# Patient Record
Sex: Male | Born: 1980 | Hispanic: No | Marital: Married | Smoking: Former smoker
Health system: Southern US, Community
[De-identification: ages and names within clinical notes are randomized; demographics above are authoritative.]

## PROBLEM LIST (undated history)

## (undated) DIAGNOSIS — G8929 Other chronic pain: Secondary | ICD-10-CM

## (undated) DIAGNOSIS — M25519 Pain in unspecified shoulder: Secondary | ICD-10-CM

## (undated) HISTORY — PX: ANKLE SURGERY: SHX546

## (undated) HISTORY — PX: FOOT SURGERY: SHX648

## (undated) HISTORY — PX: KNEE SURGERY: SHX244

## (undated) HISTORY — PX: SHOULDER SURGERY: SHX246

## (undated) HISTORY — PX: ELBOW SURGERY: SHX618

## (undated) HISTORY — PX: FACIAL FRACTURE SURGERY: SHX1570

---

## 2003-07-02 ENCOUNTER — Ambulatory Visit (HOSPITAL_BASED_OUTPATIENT_CLINIC_OR_DEPARTMENT_OTHER): Admission: RE | Admit: 2003-07-02 | Discharge: 2003-07-02 | Payer: Self-pay | Admitting: Orthopaedic Surgery

## 2012-10-12 ENCOUNTER — Emergency Department (INDEPENDENT_AMBULATORY_CARE_PROVIDER_SITE_OTHER)
Admission: EM | Admit: 2012-10-12 | Discharge: 2012-10-12 | Disposition: A | Discharge status: Home / Self Care | Payer: BC Managed Care – PPO | Source: Home / Self Care

## 2012-10-12 DIAGNOSIS — Z23 Encounter for immunization: Secondary | ICD-10-CM

## 2012-10-12 MED ORDER — INFLUENZA VAC TYP A&B SURF ANT IM INJ
0.5000 mL | INJECTION | Freq: Once | INTRAMUSCULAR | Status: AC
  Administered 2012-10-12: 0.5 mL via INTRAMUSCULAR

## 2012-10-12 NOTE — ED Notes (Signed)
Flu shot

## 2014-10-07 ENCOUNTER — Emergency Department (INDEPENDENT_AMBULATORY_CARE_PROVIDER_SITE_OTHER)
Admission: EM | Admit: 2014-10-07 | Discharge: 2014-10-07 | Disposition: A | Discharge status: Home / Self Care | Payer: 59 | Source: Home / Self Care | Attending: Emergency Medicine | Admitting: Emergency Medicine

## 2014-10-07 ENCOUNTER — Encounter: Payer: Self-pay | Admitting: *Deleted

## 2014-10-07 DIAGNOSIS — J208 Acute bronchitis due to other specified organisms: Secondary | ICD-10-CM

## 2014-10-07 DIAGNOSIS — J209 Acute bronchitis, unspecified: Secondary | ICD-10-CM | POA: Diagnosis not present

## 2014-10-07 DIAGNOSIS — J029 Acute pharyngitis, unspecified: Secondary | ICD-10-CM

## 2014-10-07 HISTORY — DX: Other chronic pain: G89.29

## 2014-10-07 HISTORY — DX: Pain in unspecified shoulder: M25.519

## 2014-10-07 LAB — POCT INFLUENZA A/B
Influenza A, POC: NEGATIVE
Influenza B, POC: NEGATIVE

## 2014-10-07 MED ORDER — AZITHROMYCIN 250 MG PO TABS: 250.0000 mg | ORAL_TABLET | Freq: Every day | ORAL | Status: DC

## 2014-10-07 MED ORDER — BENZONATATE 100 MG PO CAPS: 100.0000 mg | ORAL_CAPSULE | Freq: Three times a day (TID) | ORAL | Status: DC

## 2014-10-07 NOTE — ED Provider Notes (Signed)
CSN: [id number]     Arrival date & time 10/07/14  1528 History   First MD Initiated Contact with Patient 10/07/14 1607     Chief Complaint  Patient presents with  . Generalized Body Aches  . Cough  . Nasal Congestion   (Consider location/radiation/quality/duration/timing/severity/associated sxs/prior Treatment) Patient is a 34 y.o. male presenting with cough. The history is provided by the patient. No language interpreter was used.  Cough Cough characteristics:  Productive Sputum characteristics:  Green Severity:  Moderate Onset quality:  Gradual Duration:  1 week Timing:  Constant Progression:  Worsening Chronicity:  New Smoker: no   Context: upper respiratory infection   Relieved by:  Nothing Worsened by:  Nothing tried Ineffective treatments:  None tried Associated symptoms: rhinorrhea and sinus congestion     Past Medical History  Diagnosis Date  . Chronic shoulder pain right   Past Surgical History  Procedure Laterality Date  . Shoulder surgery Right   . Elbow surgery Left   . Facial fracture surgery    . Foot surgery Bilateral   . Knee surgery Left   . Ankle surgery Right    History reviewed. No pertinent family history. History  Substance Use Topics  . Smoking status: Former Games developermoker  . Smokeless tobacco: Not on file  . Alcohol Use: No    Review of Systems  HENT: Positive for rhinorrhea.   Respiratory: Positive for cough.   All other systems reviewed and are negative.   Allergies  Penicillins  Home Medications   Prior to Admission medications   Medication Sig Start Date End Date Taking? Authorizing Provider  HYDROcodone-acetaminophen (NORCO) 10-325 MG per tablet Take 1 tablet by mouth every 6 (six) hours as needed.   Yes Historical Provider, MD  pregabalin (LYRICA) 100 MG capsule Take 100 mg by mouth 2 (two) times daily.   Yes Historical Provider, MD  azithromycin (ZITHROMAX) 250 MG tablet Take 1 tablet (250 mg total) by mouth daily. Take first 2  tablets together, then 1 every day until finished. 10/07/14   Elson AreasLeslie K Altariq Goodall, PA-C  benzonatate (TESSALON) 100 MG capsule Take 1 capsule (100 mg total) by mouth every 8 (eight) hours. 10/07/14   Elson AreasLeslie K Izeah Vossler, PA-C   BP 131/83 mmHg  Pulse 87  Temp(Src) 98 F (36.7 C) (Oral)  Resp 18  Ht 5' 11.5" (1.816 m)  Wt 283 lb (128.368 kg)  BMI 38.92 kg/m2  SpO2 97% Physical Exam  Constitutional: He is oriented to person, place, and time. He appears well-developed and well-nourished.  HENT:  Head: Normocephalic and atraumatic.  Right Ear: External ear normal.  Left Ear: External ear normal.  Nose: Nose normal.  Mouth/Throat: Oropharynx is clear and moist.  Eyes: EOM are normal. Pupils are equal, round, and reactive to light.  Neck: Normal range of motion.  Cardiovascular: Normal rate and normal heart sounds.   Pulmonary/Chest: Effort normal.  Abdominal: Soft. He exhibits no distension.  Musculoskeletal: Normal range of motion.  Neurological: He is alert and oriented to person, place, and time.  Skin: Skin is warm.  Psychiatric: He has a normal mood and affect.  Nursing note and vitals reviewed.   ED Course  Procedures (including critical care time) Labs Review Labs Reviewed  POCT INFLUENZA A/B    Imaging Review No results found.   MDM   1. Acute bronchitis due to other specified organisms    zithromax Tessalon Return if any problems.   See your Physicain for recheck if symptoms persist  past one week    Elson Areas, [place] 10/07/14 1918

## 2014-10-07 NOTE — ED Notes (Signed)
Pt c/o body aches, productive cough, nasal congestion and HA x 1 wk. No documented fever, he reports feeling warm to the touch yesterday.

## 2014-10-07 NOTE — Discharge Instructions (Signed)

## 2015-05-08 ENCOUNTER — Emergency Department (INDEPENDENT_AMBULATORY_CARE_PROVIDER_SITE_OTHER)
Admission: EM | Admit: 2015-05-08 | Discharge: 2015-05-08 | Disposition: A | Discharge status: Home / Self Care | Payer: BLUE CROSS/BLUE SHIELD | Source: Home / Self Care | Attending: Family Medicine | Admitting: Family Medicine

## 2015-05-08 ENCOUNTER — Encounter: Payer: Self-pay | Admitting: Emergency Medicine

## 2015-05-08 DIAGNOSIS — L03116 Cellulitis of left lower limb: Secondary | ICD-10-CM | POA: Diagnosis not present

## 2015-05-08 MED ORDER — MUPIROCIN 2 % EX OINT: 1.0000 "application " | TOPICAL_OINTMENT | Freq: Three times a day (TID) | CUTANEOUS | Status: DC

## 2015-05-08 MED ORDER — DOXYCYCLINE HYCLATE 100 MG PO CAPS: 100.0000 mg | ORAL_CAPSULE | Freq: Two times a day (BID) | ORAL | Status: DC

## 2015-05-08 NOTE — ED Provider Notes (Signed)
CSN: [id number]     Arrival date & time 05/08/15  1935 History   First MD Initiated Contact with Patient 05/08/15 2000     Chief Complaint  Patient presents with  . Insect Bite     HPI Comments: Four days ago patient noticed a swollen painful area on his left lower posterior leg above the ankle, and wondered if it had been caused by an insect sting.  The next day the lesion drained purulent fluid, and the swelling and redness have persisted.  The lesion continues to drain mucoid fluid.  He states that his Tdap is current.  The history is provided by the patient.    Past Medical History  Diagnosis Date  . Chronic shoulder pain right   Past Surgical History  Procedure Laterality Date  . Shoulder surgery Right   . Elbow surgery Left   . Facial fracture surgery    . Foot surgery Bilateral   . Knee surgery Left   . Ankle surgery Right    History reviewed. No pertinent family history. History  Substance Use Topics  . Smoking status: Former Games developer  . Smokeless tobacco: Not on file  . Alcohol Use: No    Review of Systems  Constitutional: Negative for fever, chills, diaphoresis and fatigue.  HENT: Negative.   Eyes: Negative.   Respiratory: Negative.   Cardiovascular: Negative.   Gastrointestinal: Negative.   Genitourinary: Negative.   Musculoskeletal: Negative.   Skin: Positive for wound.  Neurological: Negative for headaches.    Allergies  Penicillins  Home Medications   Prior to Admission medications   Medication Sig Start Date End Date Taking? Authorizing Provider  doxycycline (VIBRAMYCIN) 100 MG capsule Take 1 capsule (100 mg total) by mouth 2 (two) times daily. Take with food. 05/08/15   Lattie Haw, MD  HYDROcodone-acetaminophen (NORCO) 10-325 MG per tablet Take 1 tablet by mouth every 6 (six) hours as needed.    Historical Provider, MD  mupirocin ointment (BACTROBAN) 2 % Apply 1 application topically 3 (three) times daily. 05/08/15   Lattie Haw, MD  pregabalin  (LYRICA) 100 MG capsule Take 100 mg by mouth 2 (two) times daily.    Historical Provider, MD   BP 125/86 mmHg  Pulse 88  Temp(Src) 98.1 F (36.7 C) (Oral)  SpO2 96% Physical Exam  Constitutional: He is oriented to person, place, and time. He appears well-developed and well-nourished. No distress.  HENT:  Head: Atraumatic.  Eyes: Conjunctivae are normal. Pupils are equal, round, and reactive to light.  Neck: Neck supple.  Cardiovascular: Normal heart sounds.   Pulmonary/Chest: Breath sounds normal.  Abdominal: There is no tenderness.  Musculoskeletal:       Feet:  Left posterior ankle above the insertion of achilles tendon is a 2 cm diameter superficial erythematous raised area with central drainage of a small amount of honey colored fluid.  The area is tender to palpation.  He has no achilles pain when plantar flexing the left foot.  Lymphadenopathy:    He has no cervical adenopathy.  Neurological: He is alert and oriented to person, place, and time.  Skin: Skin is warm and dry.  Nursing note and vitals reviewed.   ED Course  Procedures  None    Labs Reviewed  WOUND CULTURE      MDM   1. Cellulitis of left ankle    Culture pending. Begin doxycline  BID for staph coverage, and topical Bactroban.  Apply heat pad several times daily. Keep wound bandaged, and clean and dry. Change bandage daily Followup with Urgent care center if becomes worse while on vacation.     Lattie Haw, MD 05/10/15 [phone]

## 2015-05-08 NOTE — Discharge Instructions (Signed)
Keep wound bandaged, and clean and dry. Change bandage daily   Cellulitis Cellulitis is an infection of the skin and the tissue beneath it. The infected area is usually red and tender. Cellulitis occurs most often in the arms and lower legs.  CAUSES  Cellulitis is caused by bacteria that enter the skin through cracks or cuts in the skin. The most common types of bacteria that cause cellulitis are staphylococci and streptococci. SIGNS AND SYMPTOMS   Redness and warmth.  Swelling.  Tenderness or pain.  Fever. DIAGNOSIS  Your health care provider can usually determine what is wrong based on a physical exam. Blood tests may also be done. TREATMENT  Treatment usually involves taking an antibiotic medicine. HOME CARE INSTRUCTIONS   Take your antibiotic medicine as directed by your health care provider. Finish the antibiotic even if you start to feel better.  Keep the infected arm or leg elevated to reduce swelling.  Apply a warm cloth to the affected area up to 4 times per day to relieve pain.  Take medicines only as directed by your health care provider.  Keep all follow-up visits as directed by your health care provider. SEEK MEDICAL CARE IF:   You notice red streaks coming from the infected area.  Your red area gets larger or turns dark in color.  Your bone or joint underneath the infected area becomes painful after the skin has healed.  Your infection returns in the same area or another area.  You notice a swollen bump in the infected area.  You develop new symptoms.  You have a fever. SEEK IMMEDIATE MEDICAL CARE IF:   You feel very sleepy.  You develop vomiting or diarrhea.  You have a general ill feeling (malaise) with muscle aches and pains. MAKE SURE YOU:   Understand these instructions.  Will watch your condition.  Will get help right away if you are not doing well or get worse. Document Released: 06/29/2005 Document Revised: 02/03/2014 Document  Reviewed: 12/05/2011 [facility] Patient Information 2015 [place], [place]. This information is not intended to replace advice given to you by your health care provider. Make sure you discuss any questions you have with your health care provider.

## 2015-05-08 NOTE — ED Notes (Signed)
Pt states 3 days ago he noticed a pus filled area that looked like an insect bite on his left ankle. States he has had drainage...swelling and redness since then.

## 2015-05-14 ENCOUNTER — Telehealth: Payer: Self-pay | Admitting: Emergency Medicine

## 2015-05-16 LAB — WOUND CULTURE
GRAM STAIN: NONE SEEN
Gram Stain: NONE SEEN

## 2016-06-23 DIAGNOSIS — Z23 Encounter for immunization: Secondary | ICD-10-CM | POA: Diagnosis not present

## 2016-07-01 DIAGNOSIS — I83891 Varicose veins of right lower extremities with other complications: Secondary | ICD-10-CM | POA: Diagnosis not present

## 2016-08-02 DIAGNOSIS — I83891 Varicose veins of right lower extremities with other complications: Secondary | ICD-10-CM | POA: Diagnosis not present

## 2016-10-04 DIAGNOSIS — I83891 Varicose veins of right lower extremities with other complications: Secondary | ICD-10-CM | POA: Diagnosis not present

## 2016-11-02 DIAGNOSIS — Z0000 Encounter for general adult medical examination without abnormal findings: Secondary | ICD-10-CM | POA: Diagnosis not present

## 2016-11-02 DIAGNOSIS — Z13220 Encounter for screening for lipoid disorders: Secondary | ICD-10-CM | POA: Diagnosis not present

## 2016-11-02 DIAGNOSIS — R7303 Prediabetes: Secondary | ICD-10-CM | POA: Diagnosis not present

## 2016-11-02 DIAGNOSIS — Z6839 Body mass index (BMI) 39.0-39.9, adult: Secondary | ICD-10-CM | POA: Diagnosis not present

## 2017-04-26 DIAGNOSIS — F17200 Nicotine dependence, unspecified, uncomplicated: Secondary | ICD-10-CM | POA: Diagnosis not present

## 2017-04-26 DIAGNOSIS — I10 Essential (primary) hypertension: Secondary | ICD-10-CM | POA: Diagnosis not present

## 2017-04-26 DIAGNOSIS — Z716 Tobacco abuse counseling: Secondary | ICD-10-CM | POA: Diagnosis not present

## 2017-04-26 DIAGNOSIS — G4709 Other insomnia: Secondary | ICD-10-CM | POA: Diagnosis not present

## 2017-05-22 DIAGNOSIS — R899 Unspecified abnormal finding in specimens from other organs, systems and tissues: Secondary | ICD-10-CM | POA: Diagnosis not present

## 2017-05-26 DIAGNOSIS — Z3009 Encounter for other general counseling and advice on contraception: Secondary | ICD-10-CM | POA: Diagnosis not present

## 2017-05-26 DIAGNOSIS — E669 Obesity, unspecified: Secondary | ICD-10-CM | POA: Diagnosis not present

## 2017-05-26 DIAGNOSIS — I10 Essential (primary) hypertension: Secondary | ICD-10-CM | POA: Diagnosis not present

## 2017-06-16 DIAGNOSIS — I10 Essential (primary) hypertension: Secondary | ICD-10-CM | POA: Diagnosis not present

## 2017-06-16 DIAGNOSIS — E669 Obesity, unspecified: Secondary | ICD-10-CM | POA: Diagnosis not present

## 2017-07-19 DIAGNOSIS — Z23 Encounter for immunization: Secondary | ICD-10-CM | POA: Diagnosis not present

## 2017-11-23 ENCOUNTER — Other Ambulatory Visit: Payer: Self-pay

## 2017-11-23 ENCOUNTER — Emergency Department (INDEPENDENT_AMBULATORY_CARE_PROVIDER_SITE_OTHER)
Admission: EM | Admit: 2017-11-23 | Discharge: 2017-11-23 | Disposition: A | Discharge status: Home / Self Care | Payer: BLUE CROSS/BLUE SHIELD | Source: Home / Self Care | Attending: Family Medicine | Admitting: Family Medicine

## 2017-11-23 ENCOUNTER — Emergency Department (INDEPENDENT_AMBULATORY_CARE_PROVIDER_SITE_OTHER): Payer: BLUE CROSS/BLUE SHIELD

## 2017-11-23 ENCOUNTER — Encounter: Payer: Self-pay | Admitting: *Deleted

## 2017-11-23 DIAGNOSIS — M76892 Other specified enthesopathies of left lower limb, excluding foot: Secondary | ICD-10-CM | POA: Diagnosis not present

## 2017-11-23 DIAGNOSIS — M179 Osteoarthritis of knee, unspecified: Secondary | ICD-10-CM | POA: Diagnosis not present

## 2017-11-23 DIAGNOSIS — M25562 Pain in left knee: Secondary | ICD-10-CM

## 2017-11-23 MED ORDER — PREDNISONE 20 MG PO TABS: ORAL_TABLET | ORAL | 0 refills | Status: DC

## 2017-11-23 NOTE — Discharge Instructions (Signed)
Continue knee brace daytime.

## 2017-11-23 NOTE — ED Triage Notes (Signed)
Pt c/o LT knee pain with burning in his LT thigh x 1 month. He has applied ice. Denies injury.

## 2017-11-23 NOTE — ED Provider Notes (Addendum)
[name]    CSN: [id number] Arrival date & time: 11/23/17  1712     History   Chief Complaint Chief Complaint  Patient presents with  . Knee Pain    HPI [name] is a 37 y.o. male.   Patient complains of persistent pain in his left knee for one month.  He denies injury.  He also has a chronic burning paresthesia in his left anterior thigh that radiates to the left knee, present before his left knee pain.  His pain is worse when climbing stairs.  He has significant other musculoskeletal problems, presently followed by orthopedist for right shoulder pain.   The history is provided by the patient.  Knee Pain  Location:  Knee Time since incident:  1 month Injury: no   Knee location:  L knee Pain details:    Quality:  Aching and burning   Radiates to:  Does not radiate   Severity:  Moderate   Onset quality:  Gradual   Duration:  1 month   Timing:  Constant   Progression:  Worsening Chronicity:  New Prior injury to area:  No Relieved by:  Nothing Worsened by:  Bearing weight, extension and flexion Ineffective treatments:  Ice Associated symptoms: decreased ROM and stiffness     Past Medical History:  Diagnosis Date  . Chronic shoulder pain right    There are no active problems to display for this patient.   Past Surgical History:  Procedure Laterality Date  . ANKLE SURGERY Right   . ELBOW SURGERY Left   . FACIAL FRACTURE SURGERY    . FOOT SURGERY Bilateral   . KNEE SURGERY Left   . SHOULDER SURGERY Right        Home Medications    Prior to Admission medications   Medication Sig Start Date End Date Taking? Authorizing Provider  HYDROcodone-acetaminophen (NORCO) 10-325 MG per tablet Take 1 tablet by mouth every 6 (six) hours as needed.    [provider]  predniSONE (DELTASONE) 20 MG tablet Take one tab by mouth twice daily for 4 days, then one daily. Take with food. 11/23/17   Lattie Haw, MD  pregabalin (LYRICA) 100  MG capsule Take 100 mg by mouth 2 (two) times daily.    [provider]    Family History History reviewed. No pertinent family history.  Social History Social History   Tobacco Use  . Smoking status: Former Games developer  . Smokeless tobacco: Never Used  Substance Use Topics  . Alcohol use: No  . Drug use: No     Allergies   Penicillins   Review of Systems Review of Systems  Musculoskeletal: Positive for stiffness.  All other systems reviewed and are negative.    Physical Exam Triage Vital Signs ED Triage Vitals  Enc Vitals Group     BP 11/23/17 1749 (!) 158/92     Pulse Rate 11/23/17 1749 97     Resp 11/23/17 1749 16     Temp 11/23/17 1749 97.9 F (36.6 C)     Temp Source 11/23/17 1749 Oral     SpO2 11/23/17 1749 96 %     Weight 11/23/17 1750 295 lb (133.8 kg)     Height 11/23/17 1750 5' 11.5" (1.816 m)     Head Circumference --      Peak Flow --      Pain Score 11/23/17 1750 7     Pain Loc --      Pain Edu? --  Excl. in GC? --    No data found.  Updated Vital Signs BP (!) 158/92 (BP Location: Right Arm)   Pulse 97   Temp 97.9 F (36.6 C) (Oral)   Resp 16   Ht 5' 11.5" (1.816 m)   Wt 295 lb (133.8 kg)   SpO2 96%   BMI 40.57 kg/m   Visual Acuity Right Eye Distance:   Left Eye Distance:   Bilateral Distance:    Right Eye Near:   Left Eye Near:    Bilateral Near:     Physical Exam  Constitutional: He appears well-developed and well-nourished. No distress.  Eyes: Pupils are equal, round, and reactive to light.  Neck: Normal range of motion.  Cardiovascular: Normal rate.  Pulmonary/Chest: Effort normal.  Musculoskeletal: He exhibits no edema.       Left knee: He exhibits normal range of motion, no swelling, no effusion, no ecchymosis and no deformity. Tenderness found.       Legs: Left knee:  No effusion, erythema, or warmth.  Knee stable, negative drawer test.  McMurray test negative.   Tenderness lateral edge of patella and  possibly iliotibial band.  Tenderness also over LCL, increased with varus movement.  Neurological: He is alert.  Skin: Skin is warm and dry.  Nursing note and vitals reviewed.    UC Treatments / Results  Labs (all labs ordered are listed, but only abnormal results are displayed) Labs Reviewed - No data to display  EKG  EKG Interpretation None       Radiology No results found.  Procedures Procedures (including critical care time)  Medications Ordered in UC Medications - No data to display   Initial Impression / Assessment and Plan / UC Course  I have reviewed the triage vital signs and the nursing notes.  Pertinent labs & imaging results that were available during my care of the patient were reviewed by me and considered in my medical decision making (see chart for details).    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 Begin prednisone burst/taper. Continue knee brace daytime. Followup with Dr. Rodney Langtonhomas Thekkekandam or Dr. Clementeen GrahamEvan Corey ([facility]) for further management.    Final Clinical Impressions(s) / UC Diagnoses   Final diagnoses:  Left anterior knee pain    ED Discharge Orders        Ordered    predniSONE (DELTASONE) 20 MG tablet     11/23/17 1908          Lattie HawBeese, Alleah Dearman A, MD 11/26/17 1201    Lattie HawBeese, Alaa Eyerman A, MD 11/26/17 [phone]

## 2017-12-11 ENCOUNTER — Ambulatory Visit (INDEPENDENT_AMBULATORY_CARE_PROVIDER_SITE_OTHER): Payer: BLUE CROSS/BLUE SHIELD

## 2017-12-11 ENCOUNTER — Ambulatory Visit (INDEPENDENT_AMBULATORY_CARE_PROVIDER_SITE_OTHER): Payer: BLUE CROSS/BLUE SHIELD | Admitting: Sports Medicine

## 2017-12-11 DIAGNOSIS — M1712 Unilateral primary osteoarthritis, left knee: Secondary | ICD-10-CM

## 2017-12-11 DIAGNOSIS — M170 Bilateral primary osteoarthritis of knee: Secondary | ICD-10-CM | POA: Insufficient documentation

## 2017-12-11 DIAGNOSIS — M5011 Cervical disc disorder with radiculopathy,  high cervical region: Secondary | ICD-10-CM | POA: Diagnosis not present

## 2017-12-11 DIAGNOSIS — M47812 Spondylosis without myelopathy or radiculopathy, cervical region: Secondary | ICD-10-CM | POA: Diagnosis not present

## 2017-12-11 DIAGNOSIS — M5030 Other cervical disc degeneration, unspecified cervical region: Secondary | ICD-10-CM

## 2017-12-11 NOTE — Addendum Note (Signed)
Addended by: Monica BectonHEKKEKANDAM, Jazmine Heckman J on: 12/11/2017 03:31 PM   Modules accepted: Orders

## 2017-12-11 NOTE — Assessment & Plan Note (Signed)
With bilateral C8 radiculitis. Neck x-rays, rehab exercises given, return in 1 month, MR for interventional planning if no better.

## 2017-12-11 NOTE — Addendum Note (Signed)
Addended by: Monica BectonHEKKEKANDAM, THOMAS J on: 12/11/2017 03:17 PM   Modules accepted: Level of Service

## 2017-12-11 NOTE — Progress Notes (Addendum)
Subjective:    I'm seeing this patient as a consultation for: Dr. Donna ChristenStephen Beese  CC: Left knee pain  HPI: This is a pleasant 37 year old male, for the past several months he has had worsening pain in the anterior lateral aspect of his left knee, no trauma, no radiation, no mechanical symptoms, worse with flexion past 90 degrees, moderate, persistent, no radiation.  He is tried oral NSAIDs, a burst of prednisone without much efficacy.  I reviewed the past medical history, family history, social history, surgical history, and allergies today and no changes were needed.  Please see the problem list section below in epic for further details.  Past Medical History: Past Medical History:  Diagnosis Date  . Chronic shoulder pain right   Past Surgical History: Past Surgical History:  Procedure Laterality Date  . ANKLE SURGERY Right   . ELBOW SURGERY Left   . FACIAL FRACTURE SURGERY    . FOOT SURGERY Bilateral   . KNEE SURGERY Left   . SHOULDER SURGERY Right    Social History: Social History   Socioeconomic History  . Marital status: Married    Spouse name: Not on file  . Number of children: Not on file  . Years of education: Not on file  . Highest education level: Not on file  Social Needs  . Financial resource strain: Not on file  . Food insecurity - worry: Not on file  . Food insecurity - inability: Not on file  . Transportation needs - medical: Not on file  . Transportation needs - non-medical: Not on file  Occupational History  . Not on file  Tobacco Use  . Smoking status: Former Games developermoker  . Smokeless tobacco: Never Used  Substance and Sexual Activity  . Alcohol use: No  . Drug use: No  . Sexual activity: Not on file  Other Topics Concern  . Not on file  Social History Narrative  . Not on file   Family History: No family history on file. Allergies: Allergies  Allergen Reactions  . Penicillins    Medications: See med rec.  Review of Systems: No headache,  visual changes, nausea, vomiting, diarrhea, constipation, dizziness, abdominal pain, skin rash, fevers, chills, night sweats, weight loss, swollen lymph nodes, body aches, joint swelling, muscle aches, chest pain, shortness of breath, mood changes, visual or auditory hallucinations.   Objective:   General: Well Developed, well nourished, and in no acute distress.  Neuro:  Extra-ocular muscles intact, able to move all 4 extremities, sensation grossly intact.  Deep tendon reflexes tested were normal. Psych: Alert and oriented, mood congruent with affect. ENT:  Ears and nose appear unremarkable.  Hearing grossly normal. Neck: Unremarkable overall appearance, trachea midline.  No visible thyroid enlargement. Eyes: Conjunctivae and lids appear unremarkable.  Pupils equal and round. Skin: Warm and dry, no rashes noted.  Cardiovascular: Pulses palpable, no extremity edema. Left knee: Normal to inspection with no erythema or effusion or obvious bony abnormalities. Tender to palpation at the medial joint line with reproduction of pain with terminal flexion past 90 degrees ROM normal in flexion and extension and lower leg rotation. Ligaments with solid consistent endpoints including ACL, PCL, LCL, MCL. Negative Mcmurray's and provocative meniscal tests. Non painful patellar compression. Patellar and quadriceps tendons unremarkable. Hamstring and quadriceps strength is normal.  X-rays reviewed and show mild osteoarthritis.  Procedure: Real-time Ultrasound Guided Injection of left knee Device: GE Logiq E  Verbal informed consent obtained.  Time-out conducted.  Noted no overlying erythema,  induration, or other signs of local infection.  Skin prepped in a sterile fashion.  Local anesthesia: Topical Ethyl chloride.  With sterile technique and under real time ultrasound guidance: 1 cc Kenalog 40, 2 cc lidocaine, 2 cc bupivacaine injected easily Completed without difficulty  Pain immediately resolved  suggesting accurate placement of the medication.  Advised to call if fevers/chills, erythema, induration, drainage, or persistent bleeding.  Images permanently stored and available for review in the ultrasound unit.  Impression: Technically successful ultrasound guided injection.  Impression and Recommendations:   This case required medical decision making of moderate complexity.  Primary osteoarthritis of left knee Failed NSAIDs, prednisone. Injection as above, rehab exercises given, return to see me in 1 month, MR if no better.  DDD (degenerative disc disease), cervical With bilateral C8 radiculitis. Neck x-rays, rehab exercises given, return in 1 month, MR for interventional planning if no better. ___________________________________________ Ihor Austin. Benjamin Stain, M.D., ABFM., CAQSM. Primary Care and Sports Medicine [facility]  Adjunct Instructor of Family Medicine  [employer]

## 2017-12-11 NOTE — Assessment & Plan Note (Signed)
Failed NSAIDs, prednisone. Injection as above, rehab exercises given, return to see me in 1 month, MR if no better.

## 2018-01-08 ENCOUNTER — Encounter: Payer: Self-pay | Admitting: Sports Medicine

## 2018-01-08 ENCOUNTER — Ambulatory Visit (INDEPENDENT_AMBULATORY_CARE_PROVIDER_SITE_OTHER): Payer: BLUE CROSS/BLUE SHIELD

## 2018-01-08 ENCOUNTER — Ambulatory Visit (INDEPENDENT_AMBULATORY_CARE_PROVIDER_SITE_OTHER): Payer: BLUE CROSS/BLUE SHIELD | Admitting: Sports Medicine

## 2018-01-08 DIAGNOSIS — G8929 Other chronic pain: Secondary | ICD-10-CM

## 2018-01-08 DIAGNOSIS — M25512 Pain in left shoulder: Secondary | ICD-10-CM | POA: Diagnosis not present

## 2018-01-08 DIAGNOSIS — M1712 Unilateral primary osteoarthritis, left knee: Secondary | ICD-10-CM | POA: Diagnosis not present

## 2018-01-08 NOTE — Assessment & Plan Note (Signed)
Pain is referral to both the acromioclavicular joint and the subacromial bursa, left acromioclavicular and left subacromial bursa injections. Rehab exercises given. X-rays, return in 1 month.

## 2018-01-08 NOTE — Progress Notes (Signed)
Subjective:    CC: Follow-up  HPI: This is a pleasant 37 year old male, he is coming in for follow-up of his left knee, we did an injection at the last visit and he returns today pain-free.  He still has some neck pain, tightness, known degenerative disc disease, does not desire to discuss this in any further detail today.,  He would like to pick this up at a future visit.  What he would like to discuss is his left shoulder pain, moderate, persistent, present for many years and localized over the acromial clavicular joint and at the posterior joint line, worse with overhead activities and reaching across his chest, he tells me he does have a history of a grade 3 shoulder separation.  Has tried oral NSAIDs without any improvement.  Tends to do a lot of shoulder press.  I reviewed the past medical history, family history, social history, surgical history, and allergies today and no changes were needed.  Please see the problem list section below in epic for further details.  Past Medical History: Past Medical History:  Diagnosis Date  . Chronic shoulder pain right   Past Surgical History: Past Surgical History:  Procedure Laterality Date  . ANKLE SURGERY Right   . ELBOW SURGERY Left   . FACIAL FRACTURE SURGERY    . FOOT SURGERY Bilateral   . KNEE SURGERY Left   . SHOULDER SURGERY Right    Social History: Social History   Socioeconomic History  . Marital status: Married    Spouse name: Not on file  . Number of children: Not on file  . Years of education: Not on file  . Highest education level: Not on file  Occupational History  . Not on file  Social Needs  . Financial resource strain: Not on file  . Food insecurity:    Worry: Not on file    Inability: Not on file  . Transportation needs:    Medical: Not on file    Non-medical: Not on file  Tobacco Use  . Smoking status: Former Games developer  . Smokeless tobacco: Never Used  Substance and Sexual Activity  . Alcohol use: No    . Drug use: No  . Sexual activity: Not on file  Lifestyle  . Physical activity:    Days per week: Not on file    Minutes per session: Not on file  . Stress: Not on file  Relationships  . Social connections:    Talks on phone: Not on file    Gets together: Not on file    Attends religious service: Not on file    Active member of club or organization: Not on file    Attends meetings of clubs or organizations: Not on file    Relationship status: Not on file  Other Topics Concern  . Not on file  Social History Narrative  . Not on file   Family History: No family history on file. Allergies: Allergies  Allergen Reactions  . Penicillins    Medications: See med rec.  Review of Systems: No fevers, chills, night sweats, weight loss, chest pain, or shortness of breath.   Objective:    General: Well Developed, well nourished, and in no acute distress.  Neuro: Alert and oriented x3, extra-ocular muscles intact, sensation grossly intact.  HEENT: Normocephalic, atraumatic, pupils equal round reactive to light, neck supple, no masses, no lymphadenopathy, thyroid nonpalpable.  Skin: Warm and dry, no rashes. Cardiac: Regular rate and rhythm, no murmurs rubs or gallops, no  lower extremity edema.  Respiratory: Clear to auscultation bilaterally. Not using accessory muscles, speaking in full sentences. Left shoulder: Inspection reveals no abnormalities, atrophy or asymmetry. Tender to palpation over the acromioclavicular joint with a positive crossarm sign ROM is full in all planes. Rotator cuff strength normal throughout. Positive Neer and Hawkin's tests, empty can. Speeds and Yergason's tests normal. No labral pathology noted with negative Obrien's, negative crank, negative clunk, and good stability. Normal scapular function observed. No painful arc and no drop arm sign. No apprehension sign  Procedure: Real-time Ultrasound Guided Injection of left acromioclavicular joint Device: GE  Logiq E  Verbal informed consent obtained.  Time-out conducted.  Noted no overlying erythema, induration, or other signs of local infection.  Skin prepped in a sterile fashion.  Local anesthesia: Topical Ethyl chloride.  With sterile technique and under real time ultrasound guidance: 1 cc Kenalog 40, 1 cc lidocaine injected easily Completed without difficulty  Pain immediately resolved suggesting accurate placement of the medication.  Advised to call if fevers/chills, erythema, induration, drainage, or persistent bleeding.  Images permanently stored and available for review in the ultrasound unit.  Impression: Technically successful ultrasound guided injection.  Procedure: Real-time Ultrasound Guided Injection of left subacromial bursa Device: GE Logiq E  Verbal informed consent obtained.  Time-out conducted.  Noted no overlying erythema, induration, or other signs of local infection.  Skin prepped in a sterile fashion.  Local anesthesia: Topical Ethyl chloride.  With sterile technique and under real time ultrasound guidance: 1 cc Kenalog 40, 1 cc lidocaine, 1 cc bupivacaine injected easily taking care to avoid intratendinous injection Completed without difficulty  Pain immediately resolved suggesting accurate placement of the medication.  Advised to call if fevers/chills, erythema, induration, drainage, or persistent bleeding.  Images permanently stored and available for review in the ultrasound unit.  Impression: Technically successful ultrasound guided injection.  Impression and Recommendations:    Chronic left shoulder pain Pain is referral to both the acromioclavicular joint and the subacromial bursa, left acromioclavicular and left subacromial bursa injections. Rehab exercises given. X-rays, return in 1 month.  Primary osteoarthritis of left knee Pain relieved and resolved after injection at the last visit ___________________________________________ Ihor Austinhomas J. Benjamin Stainhekkekandam,  M.D., ABFM., CAQSM. Primary Care and Sports Medicine [facility]  Adjunct Instructor of Family Medicine  [employer]

## 2018-01-08 NOTE — Assessment & Plan Note (Signed)
Pain relieved and resolved after injection at the last visit

## 2018-02-06 ENCOUNTER — Ambulatory Visit: Payer: BLUE CROSS/BLUE SHIELD | Admitting: Sports Medicine

## 2018-05-28 DIAGNOSIS — M25511 Pain in right shoulder: Secondary | ICD-10-CM | POA: Diagnosis not present

## 2018-06-14 DIAGNOSIS — D2372 Other benign neoplasm of skin of left lower limb, including hip: Secondary | ICD-10-CM | POA: Diagnosis not present

## 2018-07-16 DIAGNOSIS — R0683 Snoring: Secondary | ICD-10-CM | POA: Diagnosis not present

## 2018-07-16 DIAGNOSIS — I10 Essential (primary) hypertension: Secondary | ICD-10-CM | POA: Diagnosis not present

## 2018-07-16 DIAGNOSIS — G894 Chronic pain syndrome: Secondary | ICD-10-CM | POA: Diagnosis not present

## 2018-07-23 DIAGNOSIS — G4719 Other hypersomnia: Secondary | ICD-10-CM | POA: Diagnosis not present

## 2018-07-23 DIAGNOSIS — R0683 Snoring: Secondary | ICD-10-CM | POA: Diagnosis not present

## 2018-07-23 DIAGNOSIS — Z6841 Body Mass Index (BMI) 40.0 and over, adult: Secondary | ICD-10-CM | POA: Diagnosis not present

## 2018-08-24 DIAGNOSIS — G4733 Obstructive sleep apnea (adult) (pediatric): Secondary | ICD-10-CM | POA: Diagnosis not present

## 2018-08-24 DIAGNOSIS — G4719 Other hypersomnia: Secondary | ICD-10-CM | POA: Diagnosis not present

## 2018-08-24 DIAGNOSIS — R0683 Snoring: Secondary | ICD-10-CM | POA: Diagnosis not present

## 2018-09-07 DIAGNOSIS — G4733 Obstructive sleep apnea (adult) (pediatric): Secondary | ICD-10-CM | POA: Diagnosis not present

## 2018-09-10 DIAGNOSIS — G4733 Obstructive sleep apnea (adult) (pediatric): Secondary | ICD-10-CM | POA: Diagnosis not present

## 2018-09-10 DIAGNOSIS — Z6841 Body Mass Index (BMI) 40.0 and over, adult: Secondary | ICD-10-CM | POA: Diagnosis not present

## 2018-09-27 ENCOUNTER — Encounter: Payer: Self-pay | Admitting: Sports Medicine

## 2018-09-27 ENCOUNTER — Ambulatory Visit (INDEPENDENT_AMBULATORY_CARE_PROVIDER_SITE_OTHER): Payer: BLUE CROSS/BLUE SHIELD | Admitting: Sports Medicine

## 2018-09-27 DIAGNOSIS — M1712 Unilateral primary osteoarthritis, left knee: Secondary | ICD-10-CM | POA: Diagnosis not present

## 2018-09-27 NOTE — Progress Notes (Signed)
Subjective:    CC: Left knee pain  HPI: This is a pleasant 37 year old male with a history of left knee osteoarthritis, we did an injection approximately 9 months ago, he had fantastic relief until recently.  He had to move his house, now has a recurrence of pain, swelling.  Pain is mostly her anterior and medial, moderate, persistent without radiation.  I reviewed the past medical history, family history, social history, surgical history, and allergies today and no changes were needed.  Please see the problem list section below in epic for further details.  Past Medical History: Past Medical History:  Diagnosis Date  . Chronic shoulder pain right   Past Surgical History: Past Surgical History:  Procedure Laterality Date  . ANKLE SURGERY Right   . ELBOW SURGERY Left   . FACIAL FRACTURE SURGERY    . FOOT SURGERY Bilateral   . KNEE SURGERY Left   . SHOULDER SURGERY Right    Social History: Social History   Socioeconomic History  . Marital status: Married    Spouse name: Not on file  . Number of children: Not on file  . Years of education: Not on file  . Highest education level: Not on file  Occupational History  . Not on file  Social Needs  . Financial resource strain: Not on file  . Food insecurity:    Worry: Not on file    Inability: Not on file  . Transportation needs:    Medical: Not on file    Non-medical: Not on file  Tobacco Use  . Smoking status: Former Games developermoker  . Smokeless tobacco: Never Used  Substance and Sexual Activity  . Alcohol use: No  . Drug use: No  . Sexual activity: Not on file  Lifestyle  . Physical activity:    Days per week: Not on file    Minutes per session: Not on file  . Stress: Not on file  Relationships  . Social connections:    Talks on phone: Not on file    Gets together: Not on file    Attends religious service: Not on file    Active member of club or organization: Not on file    Attends meetings of clubs or organizations:  Not on file    Relationship status: Not on file  Other Topics Concern  . Not on file  Social History Narrative  . Not on file   Family History: No family history on file. Allergies: Allergies  Allergen Reactions  . Penicillins    Medications: See med rec.  Review of Systems: No fevers, chills, night sweats, weight loss, chest pain, or shortness of breath.   Objective:    General: Well Developed, well nourished, and in no acute distress.  Neuro: Alert and oriented x3, extra-ocular muscles intact, sensation grossly intact.  HEENT: Normocephalic, atraumatic, pupils equal round reactive to light, neck supple, no masses, no lymphadenopathy, thyroid nonpalpable.  Skin: Warm and dry, no rashes. Cardiac: Regular rate and rhythm, no murmurs rubs or gallops, no lower extremity edema.  Respiratory: Clear to auscultation bilaterally. Not using accessory muscles, speaking in full sentences. Left knee: Normal to inspection with no erythema or effusion or obvious bony abnormalities. Palpation normal with no warmth or joint line tenderness or patellar tenderness or condyle tenderness. ROM normal in flexion and extension and lower leg rotation. Ligaments with solid consistent endpoints including ACL, PCL, LCL, MCL. Negative Mcmurray's and provocative meniscal tests. Non painful patellar compression. Patellar and quadriceps tendons unremarkable.  Hamstring and quadriceps strength is normal.  Procedure: Real-time Ultrasound Guided Injection of left knee Device: GE Logiq E  Verbal informed consent obtained.  Time-out conducted.  Noted no overlying erythema, induration, or other signs of local infection.  Skin prepped in a sterile fashion.  Local anesthesia: Topical Ethyl chloride.  With sterile technique and under real time ultrasound guidance: 1 cc Kenalog 40, 2 cc lidocaine, 2 cc bupivacaine injected easily. Completed without difficulty  Pain immediately resolved suggesting accurate  placement of the medication.  Advised to call if fevers/chills, erythema, induration, drainage, or persistent bleeding.  Images permanently stored and available for review in the ultrasound unit.  Impression: Technically successful ultrasound guided injection.  Impression and Recommendations:    Primary osteoarthritis of left knee Repeat left knee injection, return as needed, previous injection was in March 2019. ___________________________________________ Ihor Austinhomas J. Benjamin Stainhekkekandam, M.D., ABFM., CAQSM. Primary Care and Sports Medicine [facility]  Adjunct Professor of Family Medicine  [employer]

## 2018-09-27 NOTE — Assessment & Plan Note (Signed)
Repeat left knee injection, return as needed, previous injection was in March 2019.

## 2018-10-09 DIAGNOSIS — G4733 Obstructive sleep apnea (adult) (pediatric): Secondary | ICD-10-CM | POA: Diagnosis not present

## 2018-10-29 DIAGNOSIS — R0683 Snoring: Secondary | ICD-10-CM | POA: Diagnosis not present

## 2018-10-29 DIAGNOSIS — Z6841 Body Mass Index (BMI) 40.0 and over, adult: Secondary | ICD-10-CM | POA: Diagnosis not present

## 2018-10-29 DIAGNOSIS — G4733 Obstructive sleep apnea (adult) (pediatric): Secondary | ICD-10-CM | POA: Diagnosis not present

## 2018-11-01 ENCOUNTER — Ambulatory Visit (INDEPENDENT_AMBULATORY_CARE_PROVIDER_SITE_OTHER): Payer: BLUE CROSS/BLUE SHIELD | Admitting: Sports Medicine

## 2018-11-01 DIAGNOSIS — M25512 Pain in left shoulder: Secondary | ICD-10-CM | POA: Diagnosis not present

## 2018-11-01 DIAGNOSIS — G8929 Other chronic pain: Secondary | ICD-10-CM

## 2018-11-01 DIAGNOSIS — M5030 Other cervical disc degeneration, unspecified cervical region: Secondary | ICD-10-CM

## 2018-11-01 NOTE — Assessment & Plan Note (Signed)
Glenohumeral injection today. Return to see me in a month, MRI if no better.

## 2018-11-01 NOTE — Progress Notes (Signed)
Subjective:    CC: Left shoulder pain  HPI: This is a pleasant 38 year old male, we treated him for left shoulder pain early in 2019.  He did well until recently, now having a recurrence of pain, localized over the deltoid and at the joint lines, worse with abduction and external rotation, no trauma, no mechanical symptoms.  In addition he has had some bilateral cervical radiculitis with neck pain, spasm, worse with turning to the left.  No progressive weakness, no trauma.  He is agreeable to start conservatively.  I reviewed the past medical history, family history, social history, surgical history, and allergies today and no changes were needed.  Please see the problem list section below in epic for further details.  Past Medical History: Past Medical History:  Diagnosis Date  . Chronic shoulder pain right   Past Surgical History: Past Surgical History:  Procedure Laterality Date  . ANKLE SURGERY Right   . ELBOW SURGERY Left   . FACIAL FRACTURE SURGERY    . FOOT SURGERY Bilateral   . KNEE SURGERY Left   . SHOULDER SURGERY Right    Social History: Social History   Socioeconomic History  . Marital status: Married    Spouse name: Not on file  . Number of children: Not on file  . Years of education: Not on file  . Highest education level: Not on file  Occupational History  . Not on file  Social Needs  . Financial resource strain: Not on file  . Food insecurity:    Worry: Not on file    Inability: Not on file  . Transportation needs:    Medical: Not on file    Non-medical: Not on file  Tobacco Use  . Smoking status: Former Games developermoker  . Smokeless tobacco: Never Used  Substance and Sexual Activity  . Alcohol use: No  . Drug use: No  . Sexual activity: Not on file  Lifestyle  . Physical activity:    Days per week: Not on file    Minutes per session: Not on file  . Stress: Not on file  Relationships  . Social connections:    Talks on phone: Not on file    Gets  together: Not on file    Attends religious service: Not on file    Active member of club or organization: Not on file    Attends meetings of clubs or organizations: Not on file    Relationship status: Not on file  Other Topics Concern  . Not on file  Social History Narrative  . Not on file   Family History: No family history on file. Allergies: Allergies  Allergen Reactions  . Penicillins    Medications: See med rec.  Review of Systems: No fevers, chills, night sweats, weight loss, chest pain, or shortness of breath.   Objective:    General: Well Developed, well nourished, and in no acute distress.  Neuro: Alert and oriented x3, extra-ocular muscles intact, sensation grossly intact.  HEENT: Normocephalic, atraumatic, pupils equal round reactive to light, neck supple, no masses, no lymphadenopathy, thyroid nonpalpable.  Skin: Warm and dry, no rashes. Cardiac: Regular rate and rhythm, no murmurs rubs or gallops, no lower extremity edema.  Respiratory: Clear to auscultation bilaterally. Not using accessory muscles, speaking in full sentences. Right shoulder: Inspection reveals no abnormalities, atrophy or asymmetry. Palpation is normal with no tenderness over AC joint or bicipital groove. ROM is full in all planes. Rotator cuff strength normal throughout. No signs of impingement  with negative Neer and Hawkin's tests, empty can. Speeds and Yergason's tests normal. No labral pathology noted with negative Obrien's. Positive crank test. Normal scapular function observed. No painful arc and no drop arm sign. No apprehension sign  Procedure: Real-time Ultrasound Guided Injection of the left glenohumeral joint Device: GE Logiq E  Verbal informed consent obtained.  Time-out conducted.  Noted no overlying erythema, induration, or other signs of local infection.  Skin prepped in a sterile fashion.  Local anesthesia: Topical Ethyl chloride.  With sterile technique and under real  time ultrasound guidance: 22-gauge spinal needle advanced into the glenohumeral joint taking care to avoid the labrum, I then injected 1 cc Kenalog 40, 2 cc lidocaine, 2 cc bupivacaine. Completed without difficulty  Pain immediately resolved suggesting accurate placement of the medication.  Advised to call if fevers/chills, erythema, induration, drainage, or persistent bleeding.  Images permanently stored and available for review in the ultrasound unit.  Impression: Technically successful ultrasound guided injection.  Impression and Recommendations:    DDD (degenerative disc disease), cervical Historically had bilateral cervical C8 radiculitis. Formal PT. If no better in a month we will proceed with MRI for interventional planning.  Chronic left shoulder pain Glenohumeral injection today. Return to see me in a month, MRI if no better. ___________________________________________ Ihor Austin. Benjamin Stain, M.D., ABFM., CAQSM. Primary Care and Sports Medicine [facility]  Adjunct Professor of Family Medicine  [employer]

## 2018-11-01 NOTE — Assessment & Plan Note (Signed)
Historically had bilateral cervical C8 radiculitis. Formal PT. If no better in a month we will proceed with MRI for interventional planning.

## 2018-11-09 DIAGNOSIS — G4733 Obstructive sleep apnea (adult) (pediatric): Secondary | ICD-10-CM | POA: Diagnosis not present

## 2018-12-08 DIAGNOSIS — G4733 Obstructive sleep apnea (adult) (pediatric): Secondary | ICD-10-CM | POA: Diagnosis not present

## 2018-12-19 DIAGNOSIS — G4733 Obstructive sleep apnea (adult) (pediatric): Secondary | ICD-10-CM | POA: Diagnosis not present

## 2018-12-19 DIAGNOSIS — Z6841 Body Mass Index (BMI) 40.0 and over, adult: Secondary | ICD-10-CM | POA: Diagnosis not present

## 2019-01-08 DIAGNOSIS — G4733 Obstructive sleep apnea (adult) (pediatric): Secondary | ICD-10-CM | POA: Diagnosis not present

## 2019-02-07 DIAGNOSIS — G4733 Obstructive sleep apnea (adult) (pediatric): Secondary | ICD-10-CM | POA: Diagnosis not present

## 2019-03-08 ENCOUNTER — Other Ambulatory Visit: Payer: Self-pay

## 2019-03-08 ENCOUNTER — Encounter: Payer: Self-pay | Admitting: Sports Medicine

## 2019-03-08 ENCOUNTER — Ambulatory Visit (INDEPENDENT_AMBULATORY_CARE_PROVIDER_SITE_OTHER): Payer: BC Managed Care – PPO | Admitting: Sports Medicine

## 2019-03-08 DIAGNOSIS — M5030 Other cervical disc degeneration, unspecified cervical region: Secondary | ICD-10-CM

## 2019-03-08 DIAGNOSIS — G5712 Meralgia paresthetica, left lower limb: Secondary | ICD-10-CM | POA: Diagnosis not present

## 2019-03-08 DIAGNOSIS — M1712 Unilateral primary osteoarthritis, left knee: Secondary | ICD-10-CM | POA: Diagnosis not present

## 2019-03-08 NOTE — Assessment & Plan Note (Signed)
We discussed wearing loose fitting clothing, working on aggressive weight loss with his PCP, he has had a great deal of weight gain over the past year and a half. If insufficient improvement over the next several months with the above treatment we will proceed with a hydrodissection of the lateral femoral cutaneous nerve at the anterior superior iliac spine.

## 2019-03-08 NOTE — Progress Notes (Signed)
Subjective:    CC: Multiple issues  HPI: Left knee pain: Known osteoarthritis, previous injection was approximately 6 months ago, now having recurrence pain, moderate, persistent, localized without radiation, medial joint line, posterior knee.  Neck pain: Bilateral, C8 radicular symptoms, we treated this in the past with good response to conservative measures but unfortunately he is having recurrence of pain.  We do not have an MRI yet in the chart.  Leg burning: Left-sided, from the anterior hip down to just above the knee, nothing past the knee, no back pain.  He has gained a lot of weight over the past year and a half.  I reviewed the past medical history, family history, social history, surgical history, and allergies today and no changes were needed.  Please see the problem list section below in epic for further details.  Past Medical History: Past Medical History:  Diagnosis Date  . Chronic shoulder pain right   Past Surgical History: Past Surgical History:  Procedure Laterality Date  . ANKLE SURGERY Right   . ELBOW SURGERY Left   . FACIAL FRACTURE SURGERY    . FOOT SURGERY Bilateral   . KNEE SURGERY Left   . SHOULDER SURGERY Right    Social History: Social History   Socioeconomic History  . Marital status: Married    Spouse name: Not on file  . Number of children: Not on file  . Years of education: Not on file  . Highest education level: Not on file  Occupational History  . Not on file  Social Needs  . Financial resource strain: Not on file  . Food insecurity:    Worry: Not on file    Inability: Not on file  . Transportation needs:    Medical: Not on file    Non-medical: Not on file  Tobacco Use  . Smoking status: Former Games developer  . Smokeless tobacco: Never Used  Substance and Sexual Activity  . Alcohol use: No  . Drug use: No  . Sexual activity: Not on file  Lifestyle  . Physical activity:    Days per week: Not on file    Minutes per session: Not on  file  . Stress: Not on file  Relationships  . Social connections:    Talks on phone: Not on file    Gets together: Not on file    Attends religious service: Not on file    Active member of club or organization: Not on file    Attends meetings of clubs or organizations: Not on file    Relationship status: Not on file  Other Topics Concern  . Not on file  Social History Narrative  . Not on file   Family History: No family history on file. Allergies: Allergies  Allergen Reactions  . Penicillins    Medications: See med rec.  Review of Systems: No fevers, chills, night sweats, weight loss, chest pain, or shortness of breath.   Objective:    General: Well Developed, well nourished, and in no acute distress.  Neuro: Alert and oriented x3, extra-ocular muscles intact, sensation grossly intact.  HEENT: Normocephalic, atraumatic, pupils equal round reactive to light, neck supple, no masses, no lymphadenopathy, thyroid nonpalpable.  Skin: Warm and dry, no rashes. Cardiac: Regular rate and rhythm, no murmurs rubs or gallops, no lower extremity edema.  Respiratory: Clear to auscultation bilaterally. Not using accessory muscles, speaking in full sentences. Left knee: Swollen, tender to palpation at the medial joint line, palpable effusion with fluid wave ROM normal  in flexion and extension and lower leg rotation. Ligaments with solid consistent endpoints including ACL, PCL, LCL, MCL. Negative Mcmurray's and provocative meniscal tests. Non painful patellar compression. Patellar and quadriceps tendons unremarkable. Hamstring and quadriceps strength is normal.  Procedure: Real-time Ultrasound Guided aspiration/injection of left knee Device: GE Logiq E  Verbal informed consent obtained.  Time-out conducted.  Noted no overlying erythema, induration, or other signs of local infection.  Skin prepped in a sterile fashion.  Local anesthesia: Topical Ethyl chloride.  With sterile technique  and under real time ultrasound guidance:  Aspirated 11 cc of clear, straw-colored fluid, syringe switched and 1 cc Kenalog 40, 2 cc lidocaine, 2 cc bupivacaine injected easily Completed without difficulty  Pain immediately resolved suggesting accurate placement of the medication.  Advised to call if fevers/chills, erythema, induration, drainage, or persistent bleeding.  Images permanently stored and available for review in the ultrasound unit.  Impression: Technically successful ultrasound guided injection.  Impression and Recommendations:    Primary osteoarthritis of left knee Repeat left knee aspiration and injection, getting him set up for Visco supplementation.  DDD (degenerative disc disease), cervical Persistent bilateral C8 cervical radiculitis. We are going to proceed with an MRI for interventional planning. X-rays showed multilevel cervical DDD.  Meralgia paresthetica, left We discussed wearing loose fitting clothing, working on aggressive weight loss with his PCP, he has had a great deal of weight gain over the past year and a half. If insufficient improvement over the next several months with the above treatment we will proceed with a hydrodissection of the lateral femoral cutaneous nerve at the anterior superior iliac spine.   ___________________________________________ Ihor Austinhomas J. Benjamin Stainhekkekandam, M.D., ABFM., CAQSM. Primary Care and Sports Medicine [facility]  Adjunct Professor of Family Medicine  [employer]

## 2019-03-08 NOTE — Assessment & Plan Note (Signed)
Repeat left knee aspiration and injection, getting him set up for Visco supplementation.

## 2019-03-08 NOTE — Patient Instructions (Signed)
Lateral Femoral Cutaneous Nerve Block Patient Information  Description: The lateral femoral cutaneous nerve of the thigh is a purely sensory nerve that can become entrapped or irritated for a number of reasons.  The pain associated with this condition is called meralgia paraesthetica.  Patients affected with this syndrome have burning pain or abnormal sensation along the lateral aspect of the thigh.  The pain can be worsened by prolonged walking, standing, or constrictive garments around the house.   The diagnosis can be confirmed and treatment initiated by blocking the nerve with local anesthetic (like Novocaine).  At times, a steroid solution may be injected at the same time.  The site of injection is through a tiny needle in the left, lower quadrant of the abdomen.   The entire block usually lasts less than 5 minutes.  Conditions which may be treated by lateral femoral cutaneous nerve block:   Meralagia paraesthetica  Preparation for the injection:  1. Do not eat any solid food or dairy products within 8 hours of your appointment.  2. You may drink clear liquids up to 3 hours before appointment.  Clear liquids include water, black coffee, juice or soda. No milk or cream please. 3. You may take your regular medication, including pain medications, with a sip of water before your appointment.  Diabetics should hold regular insulin (if taken separately) and take 1/2 normal NPH dose the morning of the procedure.  Carry some sugar containing items with you to your appointment. 4. A driver must accompany you and be prepared to drive you home after your procedure. 5. Bring all you current medications with you 6. An IV may be inserted and sedation may be given at the discretion of the physician. 7. A blood pressure cuff, EKG and other monitors will often be applied during the procedure.  Some patients may need to have extra oxygen administered for a short period. 8. You will be asked to provide medical  information, including your allergies and medications, prior to the procedure.  We must know immediately if you are taking blood thinners (like Coumadin/Warfarin) or if you allergic to IV iodine contrast (dye)  We must know if you could possible be pregnant.   Possible side-effects:   Bleeding from needle site  Infection (rate, may require surgery)  Nerve injury (rare)  Numbness and Tingling (temporary)  Light-headedness (temporary)  Pain at injection site (several day)  Decreased blood pressure (rare, temporary)  Weakness in leg (temporary)  Call if you experience:  Hives or difficulty breathing (go to the emergency room)  Inflammation or drainage at the injection site(s)  Please note:  Although the local anesthetic injected can often make your leg feel good for several hours after the injection,  The pain may return.  It takes 3-7 days for steroids to work.  You may not notice any pain relief for at least one week.  If effective, we will often do a series of injections spaced 3-6 weeks apart to maximally decrease your pain.  If you have any questions, please cll [phone] [facility]   

## 2019-03-08 NOTE — Assessment & Plan Note (Signed)
Persistent bilateral C8 cervical radiculitis. We are going to proceed with an MRI for interventional planning. X-rays showed multilevel cervical DDD.

## 2019-03-10 DIAGNOSIS — G4733 Obstructive sleep apnea (adult) (pediatric): Secondary | ICD-10-CM | POA: Diagnosis not present

## 2019-03-17 ENCOUNTER — Ambulatory Visit (INDEPENDENT_AMBULATORY_CARE_PROVIDER_SITE_OTHER): Payer: BC Managed Care – PPO

## 2019-03-17 ENCOUNTER — Other Ambulatory Visit: Payer: Self-pay

## 2019-03-17 DIAGNOSIS — M542 Cervicalgia: Secondary | ICD-10-CM | POA: Diagnosis not present

## 2019-03-17 DIAGNOSIS — M5030 Other cervical disc degeneration, unspecified cervical region: Secondary | ICD-10-CM | POA: Diagnosis not present

## 2019-03-19 NOTE — Addendum Note (Signed)
Addended by: Silverio Decamp on: 03/19/2019 12:11 PM   Modules accepted: Orders

## 2019-03-22 ENCOUNTER — Telehealth: Payer: Self-pay | Admitting: Sports Medicine

## 2019-03-22 NOTE — Telephone Encounter (Signed)
Patient does not want to move forward with the injections at this time. He wants to wait until a later date. Synvisc Injections have been approved from 03/21/2019 through 03/20/2020. Form will be sent to scan.    Patient also reports that he is still having neck pain and is not able to get the injection until the end of the month. He wants to know if he needs to see you again. He reports that he is not able to sleep well since the pain is so bad. Please advise.

## 2019-03-22 NOTE — Telephone Encounter (Signed)
-----   Message from Tasia Catchings, [place] sent at 03/21/2019  9:35 AM EDT ----- Information has been sent to insurance and waiting on a response.   ----- Message ----- From: Tasia Catchings, CMA Sent: 03/12/2019  11:48 AM EDT To: Tasia Catchings, CMA  Per Gwyndolyn Saxon at [facility] the preferred product is Synvisc. There will be a form faxed to the office to fill out.  ----- Message ----- From: Silverio Decamp, MD Sent: 03/08/2019   4:37 PM EDT To: Tasia Catchings, CMA  Orthovisc approval please, left knee. ___________________________________________Thomas J. Dianah Field, M.D., ABFM., CAQSM.Primary Care and Sports MedicineC[facility] Health MedCenter KernersvilleAdjunct Professor of [employer] [employer]

## 2019-04-09 DIAGNOSIS — G4733 Obstructive sleep apnea (adult) (pediatric): Secondary | ICD-10-CM | POA: Diagnosis not present

## 2019-04-16 ENCOUNTER — Ambulatory Visit
Admission: RE | Admit: 2019-04-16 | Discharge: 2019-04-16 | Disposition: A | Discharge status: Home / Self Care | Payer: BC Managed Care – PPO | Source: Ambulatory Visit | Attending: Sports Medicine | Admitting: Sports Medicine

## 2019-04-16 ENCOUNTER — Other Ambulatory Visit: Payer: Self-pay

## 2019-04-16 DIAGNOSIS — M542 Cervicalgia: Secondary | ICD-10-CM | POA: Diagnosis not present

## 2019-04-16 DIAGNOSIS — Z0000 Encounter for general adult medical examination without abnormal findings: Secondary | ICD-10-CM | POA: Diagnosis not present

## 2019-04-16 DIAGNOSIS — Z6841 Body Mass Index (BMI) 40.0 and over, adult: Secondary | ICD-10-CM | POA: Diagnosis not present

## 2019-04-16 DIAGNOSIS — I10 Essential (primary) hypertension: Secondary | ICD-10-CM | POA: Diagnosis not present

## 2019-04-16 DIAGNOSIS — E782 Mixed hyperlipidemia: Secondary | ICD-10-CM | POA: Diagnosis not present

## 2019-04-16 MED ORDER — TRIAMCINOLONE ACETONIDE 40 MG/ML IJ SUSP (RADIOLOGY)
60.0000 mg | Freq: Once | INTRAMUSCULAR | Status: AC
  Administered 2019-04-16: 09:00:00 60 mg via EPIDURAL

## 2019-04-16 MED ORDER — IOPAMIDOL (ISOVUE-M 300) INJECTION 61%
1.0000 mL | Freq: Once | INTRAMUSCULAR | Status: AC | PRN
  Administered 2019-04-16: 09:00:00 1 mL via EPIDURAL

## 2019-04-16 NOTE — Discharge Instructions (Signed)

## 2019-04-18 DIAGNOSIS — Z6841 Body Mass Index (BMI) 40.0 and over, adult: Secondary | ICD-10-CM | POA: Diagnosis not present

## 2019-04-19 DIAGNOSIS — I83891 Varicose veins of right lower extremities with other complications: Secondary | ICD-10-CM | POA: Diagnosis not present

## 2019-04-22 DIAGNOSIS — Z03818 Encounter for observation for suspected exposure to other biological agents ruled out: Secondary | ICD-10-CM | POA: Diagnosis not present

## 2019-04-22 DIAGNOSIS — Z880 Allergy status to penicillin: Secondary | ICD-10-CM | POA: Diagnosis not present

## 2019-04-22 DIAGNOSIS — Z87891 Personal history of nicotine dependence: Secondary | ICD-10-CM | POA: Diagnosis not present

## 2019-04-22 DIAGNOSIS — K219 Gastro-esophageal reflux disease without esophagitis: Secondary | ICD-10-CM | POA: Diagnosis not present

## 2019-04-22 DIAGNOSIS — I10 Essential (primary) hypertension: Secondary | ICD-10-CM | POA: Diagnosis not present

## 2019-04-22 DIAGNOSIS — Z91030 Bee allergy status: Secondary | ICD-10-CM | POA: Diagnosis not present

## 2019-04-22 DIAGNOSIS — Z79899 Other long term (current) drug therapy: Secondary | ICD-10-CM | POA: Diagnosis not present

## 2019-05-06 DIAGNOSIS — I83892 Varicose veins of left lower extremities with other complications: Secondary | ICD-10-CM | POA: Diagnosis not present

## 2019-05-08 DIAGNOSIS — I83891 Varicose veins of right lower extremities with other complications: Secondary | ICD-10-CM | POA: Diagnosis not present

## 2019-05-09 DIAGNOSIS — I83891 Varicose veins of right lower extremities with other complications: Secondary | ICD-10-CM | POA: Diagnosis not present

## 2019-05-10 DIAGNOSIS — G4733 Obstructive sleep apnea (adult) (pediatric): Secondary | ICD-10-CM | POA: Diagnosis not present

## 2019-05-21 DIAGNOSIS — I83892 Varicose veins of left lower extremities with other complications: Secondary | ICD-10-CM | POA: Diagnosis not present

## 2019-05-22 DIAGNOSIS — M25512 Pain in left shoulder: Secondary | ICD-10-CM | POA: Diagnosis not present

## 2019-06-04 DIAGNOSIS — I83891 Varicose veins of right lower extremities with other complications: Secondary | ICD-10-CM | POA: Diagnosis not present

## 2019-06-09 DIAGNOSIS — M25512 Pain in left shoulder: Secondary | ICD-10-CM | POA: Diagnosis not present

## 2019-06-09 DIAGNOSIS — Z01812 Encounter for preprocedural laboratory examination: Secondary | ICD-10-CM | POA: Diagnosis not present

## 2019-06-09 DIAGNOSIS — Z20828 Contact with and (suspected) exposure to other viral communicable diseases: Secondary | ICD-10-CM | POA: Diagnosis not present

## 2019-06-10 DIAGNOSIS — G4733 Obstructive sleep apnea (adult) (pediatric): Secondary | ICD-10-CM | POA: Diagnosis not present

## 2019-06-13 DIAGNOSIS — Z91030 Bee allergy status: Secondary | ICD-10-CM | POA: Diagnosis not present

## 2019-06-13 DIAGNOSIS — M1990 Unspecified osteoarthritis, unspecified site: Secondary | ICD-10-CM | POA: Diagnosis not present

## 2019-06-13 DIAGNOSIS — Z6838 Body mass index (BMI) 38.0-38.9, adult: Secondary | ICD-10-CM | POA: Diagnosis not present

## 2019-06-13 DIAGNOSIS — M5030 Other cervical disc degeneration, unspecified cervical region: Secondary | ICD-10-CM | POA: Diagnosis not present

## 2019-06-13 DIAGNOSIS — G8918 Other acute postprocedural pain: Secondary | ICD-10-CM | POA: Diagnosis not present

## 2019-06-13 DIAGNOSIS — E782 Mixed hyperlipidemia: Secondary | ICD-10-CM | POA: Diagnosis not present

## 2019-06-13 DIAGNOSIS — Z880 Allergy status to penicillin: Secondary | ICD-10-CM | POA: Diagnosis not present

## 2019-06-13 DIAGNOSIS — I10 Essential (primary) hypertension: Secondary | ICD-10-CM | POA: Diagnosis not present

## 2019-06-13 DIAGNOSIS — M7582 Other shoulder lesions, left shoulder: Secondary | ICD-10-CM | POA: Diagnosis not present

## 2019-06-13 DIAGNOSIS — K219 Gastro-esophageal reflux disease without esophagitis: Secondary | ICD-10-CM | POA: Diagnosis not present

## 2019-06-13 DIAGNOSIS — M7542 Impingement syndrome of left shoulder: Secondary | ICD-10-CM | POA: Diagnosis not present

## 2019-06-13 DIAGNOSIS — M75112 Incomplete rotator cuff tear or rupture of left shoulder, not specified as traumatic: Secondary | ICD-10-CM | POA: Diagnosis not present

## 2019-06-13 DIAGNOSIS — M19012 Primary osteoarthritis, left shoulder: Secondary | ICD-10-CM | POA: Diagnosis not present

## 2019-06-13 DIAGNOSIS — S43492S Other sprain of left shoulder joint, sequela: Secondary | ICD-10-CM | POA: Diagnosis not present

## 2019-06-13 DIAGNOSIS — Z79899 Other long term (current) drug therapy: Secondary | ICD-10-CM | POA: Diagnosis not present

## 2019-06-13 DIAGNOSIS — M19112 Post-traumatic osteoarthritis, left shoulder: Secondary | ICD-10-CM | POA: Diagnosis not present

## 2019-06-13 DIAGNOSIS — G5712 Meralgia paresthetica, left lower limb: Secondary | ICD-10-CM | POA: Diagnosis not present

## 2019-06-13 DIAGNOSIS — X58XXXS Exposure to other specified factors, sequela: Secondary | ICD-10-CM | POA: Diagnosis not present

## 2019-07-02 DIAGNOSIS — I83891 Varicose veins of right lower extremities with other complications: Secondary | ICD-10-CM | POA: Diagnosis not present

## 2020-11-05 ENCOUNTER — Encounter: Payer: Self-pay | Admitting: Emergency Medicine

## 2020-11-05 ENCOUNTER — Other Ambulatory Visit: Payer: Self-pay

## 2020-11-05 ENCOUNTER — Emergency Department (INDEPENDENT_AMBULATORY_CARE_PROVIDER_SITE_OTHER): Payer: Self-pay

## 2020-11-05 ENCOUNTER — Emergency Department (INDEPENDENT_AMBULATORY_CARE_PROVIDER_SITE_OTHER)
Admission: EM | Admit: 2020-11-05 | Discharge: 2020-11-05 | Disposition: A | Discharge status: Home / Self Care | Payer: BC Managed Care – PPO | Source: Home / Self Care | Attending: Family Medicine | Admitting: Family Medicine

## 2020-11-05 DIAGNOSIS — M545 Low back pain, unspecified: Secondary | ICD-10-CM

## 2020-11-05 DIAGNOSIS — M5136 Other intervertebral disc degeneration, lumbar region: Secondary | ICD-10-CM

## 2020-11-05 LAB — POCT URINALYSIS DIP (MANUAL ENTRY)
Bilirubin, UA: NEGATIVE
Blood, UA: NEGATIVE
Glucose, UA: NEGATIVE mg/dL
Ketones, POC UA: NEGATIVE mg/dL
Leukocytes, UA: NEGATIVE
Nitrite, UA: NEGATIVE
Protein Ur, POC: NEGATIVE mg/dL
Spec Grav, UA: 1.025 (ref 1.010–1.025)
Urobilinogen, UA: 0.2 E.U./dL
pH, UA: 6.5 (ref 5.0–8.0)

## 2020-11-05 MED ORDER — PREDNISONE 20 MG PO TABS: ORAL_TABLET | ORAL | 0 refills | Status: DC
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
## 2020-11-05 NOTE — Discharge Instructions (Addendum)
Apply ice pack for 20 to 30 minutes, 3 to 4 times daily  Continue until pain decreases.  °

## 2020-11-05 NOTE — ED Triage Notes (Signed)
Patient now states he does have history of renal calculi but this recent pain feels different from that.
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
## 2020-11-12 ENCOUNTER — Ambulatory Visit (INDEPENDENT_AMBULATORY_CARE_PROVIDER_SITE_OTHER): Payer: Self-pay | Admitting: Sports Medicine

## 2020-11-12 ENCOUNTER — Other Ambulatory Visit: Payer: Self-pay

## 2020-11-12 DIAGNOSIS — M5136 Other intervertebral disc degeneration, lumbar region: Secondary | ICD-10-CM

## 2020-11-12 DIAGNOSIS — M51369 Other intervertebral disc degeneration, lumbar region without mention of lumbar back pain or lower extremity pain: Secondary | ICD-10-CM | POA: Insufficient documentation

## 2020-11-12 MED ORDER — PREGABALIN 75 MG PO CAPS: 75.0000 mg | ORAL_CAPSULE | Freq: Three times a day (TID) | ORAL | 3 refills | Status: DC
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
## 2020-11-26 ENCOUNTER — Other Ambulatory Visit: Payer: Self-pay

## 2020-11-26 ENCOUNTER — Ambulatory Visit (INDEPENDENT_AMBULATORY_CARE_PROVIDER_SITE_OTHER): Payer: Self-pay | Admitting: Rehabilitative and Restorative Service Providers"

## 2020-11-26 DIAGNOSIS — M545 Low back pain, unspecified: Secondary | ICD-10-CM

## 2020-11-26 DIAGNOSIS — R29898 Other symptoms and signs involving the musculoskeletal system: Secondary | ICD-10-CM

## 2020-11-26 NOTE — Patient Instructions (Signed)
Access Code: [email or web address] URL: [email or web address] Date: 11/26/2020 Prepared by: Margretta Ditty  Exercises Hooklying Hamstring Stretch with Strap - 2 x daily - 7 x weekly - 1 sets - 3 reps - 20-30 seconds hold Supine March - 2 x daily - 7 x weekly - 1 sets - 10 reps Forearm Plank on Wall - 2 x daily - 7 x weekly - 1 sets - 3 reps - 30 seconds hold
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
## 2020-11-27 DIAGNOSIS — R6882 Decreased libido: Secondary | ICD-10-CM | POA: Diagnosis not present

## 2020-11-27 DIAGNOSIS — Z0000 Encounter for general adult medical examination without abnormal findings: Secondary | ICD-10-CM | POA: Diagnosis not present

## 2020-11-27 DIAGNOSIS — E782 Mixed hyperlipidemia: Secondary | ICD-10-CM | POA: Diagnosis not present

## 2020-11-27 DIAGNOSIS — I10 Essential (primary) hypertension: Secondary | ICD-10-CM | POA: Diagnosis not present

## 2020-12-07 ENCOUNTER — Ambulatory Visit (INDEPENDENT_AMBULATORY_CARE_PROVIDER_SITE_OTHER): Payer: Self-pay | Admitting: Rehabilitative and Restorative Service Providers"

## 2020-12-07 ENCOUNTER — Other Ambulatory Visit: Payer: Self-pay

## 2020-12-07 DIAGNOSIS — R29898 Other symptoms and signs involving the musculoskeletal system: Secondary | ICD-10-CM

## 2020-12-07 DIAGNOSIS — M545 Low back pain, unspecified: Secondary | ICD-10-CM

## 2020-12-07 NOTE — Patient Instructions (Signed)
Access Code: [email or web address] URL: [email or web address] Date: 12/07/2020 Prepared by: Margretta Ditty  Exercises Hooklying Hamstring Stretch with Strap - 2 x daily - 7 x weekly - 1 sets - 3 reps - 20-30 seconds hold Supine March - 2 x daily - 7 x weekly - 1 sets - 10 reps Hooklying Isometric Hip Flexion - 2 x daily - 7 x weekly - 1 sets - 5 reps - 5 seconds hold Bent Knee Fallouts - 2 x daily - 7 x weekly - 1 sets - 10 reps Forearm Plank on Wall - 2 x daily - 7 x weekly - 1 sets - 3 reps - 30 seconds hold  Patient Education Trigger Point Dry Needling
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
## 2020-12-14 ENCOUNTER — Ambulatory Visit (INDEPENDENT_AMBULATORY_CARE_PROVIDER_SITE_OTHER): Payer: Self-pay | Admitting: Rehabilitative and Restorative Service Providers"

## 2020-12-14 ENCOUNTER — Encounter: Payer: Self-pay | Admitting: Rehabilitative and Restorative Service Providers"

## 2020-12-14 ENCOUNTER — Other Ambulatory Visit: Payer: Self-pay

## 2020-12-14 DIAGNOSIS — R29898 Other symptoms and signs involving the musculoskeletal system: Secondary | ICD-10-CM

## 2020-12-14 DIAGNOSIS — M545 Low back pain, unspecified: Secondary | ICD-10-CM
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
## 2020-12-15 ENCOUNTER — Ambulatory Visit (INDEPENDENT_AMBULATORY_CARE_PROVIDER_SITE_OTHER): Payer: Self-pay | Admitting: Sports Medicine

## 2020-12-15 DIAGNOSIS — M1712 Unilateral primary osteoarthritis, left knee: Secondary | ICD-10-CM

## 2020-12-15 DIAGNOSIS — M5136 Other intervertebral disc degeneration, lumbar region: Secondary | ICD-10-CM

## 2020-12-15 DIAGNOSIS — M51369 Other intervertebral disc degeneration, lumbar region without mention of lumbar back pain or lower extremity pain: Secondary | ICD-10-CM

## 2020-12-15 MED ORDER — PREGABALIN 100 MG PO CAPS: 100.0000 mg | ORAL_CAPSULE | Freq: Three times a day (TID) | ORAL | 3 refills | Status: DC

## 2020-12-15 NOTE — Assessment & Plan Note (Signed)
We did an aspiration and injection back in June 2020, he did well until recently, now having recurrence of pain and swelling. We will consider aspiration and injection at a future visit, he does not want this addressed today.

## 2020-12-15 NOTE — Assessment & Plan Note (Signed)
BJ returns, he is a 40 year old male, he has known lumbar DDD, initially did well with physical therapy, Lyrica at 75 mg 3 times daily but still has significant discomfort. He has now failed greater than 6 weeks of conservative treatment. Proceeding with MRI increasing Lyrica to 100 mg 3 times daily per We will give the due diligence to an up taper to a total of 600 daily milligrams before considering epidural injections but I would like the MRI in his chart in the meantime.

## 2020-12-15 NOTE — Progress Notes (Signed)
    Procedures performed today:    None.  Independent interpretation of notes and tests performed by another provider:   None.  Brief History, Exam, Impression, and Recommendations:    Lumbar degenerative disc disease Steve [name] returns, he is a 40 year old male, he has known lumbar DDD, initially did well with physical therapy, Lyrica at 75 mg 3 times daily but still has significant discomfort. He has now failed greater than 6 weeks of conservative treatment. Proceeding with MRI increasing Lyrica to 100 mg 3 times daily per We will give the due diligence to an up taper to a total of 600 daily milligrams before considering epidural injections but I would like the MRI in his chart in the meantime.  Primary osteoarthritis of left knee We did an aspiration and injection back in June 2020, he did well until recently, now having recurrence of pain and swelling. We will consider aspiration and injection at a future visit, he does not want this addressed today.    ___________________________________________ Steve [name]. Steve [name], M.D., ABFM., CAQSM. Primary Care and Sports Medicine [facility]  Adjunct Instructor of Family Medicine  [employer]

## 2020-12-17 ENCOUNTER — Ambulatory Visit (INDEPENDENT_AMBULATORY_CARE_PROVIDER_SITE_OTHER): Payer: Self-pay | Admitting: Rehabilitative and Restorative Service Providers"

## 2020-12-17 ENCOUNTER — Encounter: Payer: Self-pay | Admitting: Rehabilitative and Restorative Service Providers"

## 2020-12-17 ENCOUNTER — Other Ambulatory Visit: Payer: Self-pay

## 2020-12-17 DIAGNOSIS — R29898 Other symptoms and signs involving the musculoskeletal system: Secondary | ICD-10-CM

## 2020-12-17 DIAGNOSIS — M545 Low back pain, unspecified: Secondary | ICD-10-CM
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
## 2020-12-20 ENCOUNTER — Ambulatory Visit (INDEPENDENT_AMBULATORY_CARE_PROVIDER_SITE_OTHER): Payer: Self-pay

## 2020-12-20 ENCOUNTER — Other Ambulatory Visit: Payer: Self-pay

## 2020-12-20 DIAGNOSIS — M5136 Other intervertebral disc degeneration, lumbar region: Secondary | ICD-10-CM

## 2020-12-20 DIAGNOSIS — M545 Low back pain, unspecified: Secondary | ICD-10-CM | POA: Diagnosis not present

## 2020-12-21 ENCOUNTER — Encounter: Payer: Self-pay | Admitting: Rehabilitative and Restorative Service Providers"

## 2020-12-24 ENCOUNTER — Ambulatory Visit (INDEPENDENT_AMBULATORY_CARE_PROVIDER_SITE_OTHER): Payer: Self-pay | Admitting: Rehabilitative and Restorative Service Providers"

## 2020-12-24 ENCOUNTER — Other Ambulatory Visit: Payer: Self-pay

## 2020-12-24 DIAGNOSIS — M545 Low back pain, unspecified: Secondary | ICD-10-CM

## 2020-12-24 DIAGNOSIS — R29898 Other symptoms and signs involving the musculoskeletal system: Secondary | ICD-10-CM

## 2020-12-24 DIAGNOSIS — G894 Chronic pain syndrome: Secondary | ICD-10-CM | POA: Diagnosis not present

## 2020-12-24 DIAGNOSIS — E782 Mixed hyperlipidemia: Secondary | ICD-10-CM | POA: Diagnosis not present

## 2020-12-24 DIAGNOSIS — I10 Essential (primary) hypertension: Secondary | ICD-10-CM | POA: Diagnosis not present
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
## 2020-12-29 ENCOUNTER — Ambulatory Visit (INDEPENDENT_AMBULATORY_CARE_PROVIDER_SITE_OTHER): Payer: BC Managed Care – PPO | Admitting: Sports Medicine

## 2020-12-29 ENCOUNTER — Ambulatory Visit (INDEPENDENT_AMBULATORY_CARE_PROVIDER_SITE_OTHER): Payer: BC Managed Care – PPO

## 2020-12-29 ENCOUNTER — Other Ambulatory Visit: Payer: Self-pay

## 2020-12-29 DIAGNOSIS — M1712 Unilateral primary osteoarthritis, left knee: Secondary | ICD-10-CM

## 2020-12-29 NOTE — Assessment & Plan Note (Signed)
This is a very pleasant 40 year old male with knee osteoarthritis, last injection was back in June 2020, repeated today. Return to see me in a month.

## 2020-12-29 NOTE — Progress Notes (Signed)
    Procedures performed today:    Procedure: Real-time Ultrasound Guided aspiration/injection of left knee Device: Samsung HS60  Verbal informed consent obtained.  Time-out conducted.  Noted no overlying erythema, induration, or other signs of local infection.  Skin prepped in a sterile fashion.  Local anesthesia: Topical Ethyl chloride.  With sterile technique and under real time ultrasound guidance:  Noted effusion, aspirated 22 mL of clear, straw-colored fluid, syringe switched and 1 cc Kenalog 40, 2 cc lidocaine, 2 cc bupivacaine injected easily Completed without difficulty  Advised to call if fevers/chills, erythema, induration, drainage, or persistent bleeding.  Images permanently stored and available for review in PACS.  Impression: Technically successful ultrasound guided injection.  Independent interpretation of notes and tests performed by another provider:   None.  Brief History, Exam, Impression, and Recommendations:    Primary osteoarthritis of left knee This is a very pleasant 40 year old male with knee osteoarthritis, last injection was back in June 2020, repeated today. Return to see me in a month.    ___________________________________________ Ihor Austin. Benjamin Stain, M.D., ABFM., CAQSM. Primary Care and Sports Medicine [facility]  Adjunct Instructor of Family Medicine  [employer]

## 2021-01-07 ENCOUNTER — Encounter: Payer: Self-pay | Admitting: Rehabilitative and Restorative Service Providers"

## 2021-01-12 ENCOUNTER — Ambulatory Visit (INDEPENDENT_AMBULATORY_CARE_PROVIDER_SITE_OTHER): Payer: BC Managed Care – PPO | Admitting: Sports Medicine

## 2021-01-12 ENCOUNTER — Other Ambulatory Visit: Payer: Self-pay

## 2021-01-12 DIAGNOSIS — M5136 Other intervertebral disc degeneration, lumbar region: Secondary | ICD-10-CM

## 2021-01-12 DIAGNOSIS — M51369 Other intervertebral disc degeneration, lumbar region without mention of lumbar back pain or lower extremity pain: Secondary | ICD-10-CM

## 2021-01-12 MED ORDER — PREGABALIN 200 MG PO CAPS: 200.0000 mg | ORAL_CAPSULE | Freq: Three times a day (TID) | ORAL | 3 refills | Status: DC

## 2021-01-12 NOTE — Progress Notes (Signed)
    Procedures performed today:    None.  Independent interpretation of notes and tests performed by another provider:   Lumbar spine MRI personally reviewed, lumbarization of S1/transitional anatomy  Brief History, Exam, Impression, and Recommendations:    Lumbar degenerative disc disease BJ is a pleasant 40 year old male with known lumbar DDD, initially did well with physical therapy, Lyrica, we increased him to 100 mg 3 times daily, unfortunately still has significant discomfort, increasing Lyrica to 200 mg 3 times daily, he has been doing some chiropractic manipulation which is acceptable, he needs a referral/order which I will provide today. Considering persistent pain and a lumbar spine MRI that shows degenerative disc disease at the L5-S1 level based on the MRI numbering scheme and pain on the right side we will proceed with a right L5-S1 interlaminar epidural. Return to see me 1 month after the injection.    ___________________________________________ Ihor Austin. Benjamin Stain, M.D., ABFM., CAQSM. Primary Care and Sports Medicine [place] [facility]  Adjunct Instructor of Family Medicine  [employer]

## 2021-01-12 NOTE — Assessment & Plan Note (Signed)
Steve [name] is a pleasant 40 year old male with known lumbar DDD, initially did well with physical therapy, Lyrica, we increased him to 100 mg 3 times daily, unfortunately still has significant discomfort, increasing Lyrica to 200 mg 3 times daily, he has been doing some chiropractic manipulation which is acceptable, he needs a referral/order which I will provide today. Considering persistent pain and a lumbar spine MRI that shows degenerative disc disease at the L5-S1 level based on the MRI numbering scheme and pain on the right side we will proceed with a right L5-S1 interlaminar epidural. Return to see me 1 month after the injection.

## 2021-01-14 ENCOUNTER — Ambulatory Visit
Admission: RE | Admit: 2021-01-14 | Discharge: 2021-01-14 | Disposition: A | Discharge status: Home / Self Care | Payer: BC Managed Care – PPO | Source: Ambulatory Visit | Attending: Sports Medicine | Admitting: Sports Medicine

## 2021-01-14 ENCOUNTER — Other Ambulatory Visit: Payer: Self-pay

## 2021-01-14 DIAGNOSIS — M5136 Other intervertebral disc degeneration, lumbar region: Secondary | ICD-10-CM

## 2021-01-14 DIAGNOSIS — M47817 Spondylosis without myelopathy or radiculopathy, lumbosacral region: Secondary | ICD-10-CM | POA: Diagnosis not present

## 2021-01-14 MED ORDER — IOPAMIDOL (ISOVUE-M 200) INJECTION 41%
1.0000 mL | Freq: Once | INTRAMUSCULAR | Status: AC
  Administered 2021-01-14: 1 mL via EPIDURAL

## 2021-01-14 MED ORDER — METHYLPREDNISOLONE ACETATE 40 MG/ML INJ SUSP (RADIOLOG
80.0000 mg | Freq: Once | INTRAMUSCULAR | Status: AC
  Administered 2021-01-14: 80 mg via EPIDURAL

## 2021-01-14 NOTE — Discharge Instructions (Signed)

## 2021-01-21 ENCOUNTER — Other Ambulatory Visit: Payer: Self-pay

## 2021-01-21 ENCOUNTER — Encounter: Payer: Self-pay | Admitting: Physical Therapy

## 2021-01-21 ENCOUNTER — Ambulatory Visit (INDEPENDENT_AMBULATORY_CARE_PROVIDER_SITE_OTHER): Payer: BC Managed Care – PPO | Admitting: Physical Therapy

## 2021-01-21 DIAGNOSIS — R29898 Other symptoms and signs involving the musculoskeletal system: Secondary | ICD-10-CM | POA: Diagnosis not present

## 2021-01-21 DIAGNOSIS — M545 Low back pain, unspecified: Secondary | ICD-10-CM

## 2021-01-21 NOTE — Patient Instructions (Signed)
Access Code: [email or web address] URL: [email or web address] Date: 01/21/2021 Prepared by: [facility]  Exercises Hooklying Hamstring Stretch with Strap - 2 x daily - 7 x weekly - 1 sets - 3 reps - 20-30 seconds hold Forearm Plank on Wall - 2 x daily - 7 x weekly - 1 sets - 3 reps - 30 seconds hold Prone on Elbows Stretch - 1 x daily - 7 x weekly - 3 sets - 10 reps Prone Alternating Arm and Leg Lifts - 1 x daily - 7 x weekly - 1 sets - 10 reps Seated Hip Flexor Stretch - 1 x daily - 7 x weekly - 1 sets - 2 reps - 20 sec hold
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
## 2021-01-28 ENCOUNTER — Ambulatory Visit (INDEPENDENT_AMBULATORY_CARE_PROVIDER_SITE_OTHER): Payer: BC Managed Care – PPO | Admitting: Rehabilitative and Restorative Service Providers"

## 2021-01-28 ENCOUNTER — Other Ambulatory Visit: Payer: Self-pay

## 2021-01-28 DIAGNOSIS — R29898 Other symptoms and signs involving the musculoskeletal system: Secondary | ICD-10-CM | POA: Diagnosis not present

## 2021-01-28 DIAGNOSIS — M545 Low back pain, unspecified: Secondary | ICD-10-CM

## 2021-01-28 NOTE — Patient Instructions (Signed)
Access Code: [email or web address] URL: [email or web address] Date: 01/28/2021 Prepared by: Margretta Ditty  Exercises Hooklying Hamstring Stretch with Strap - 2 x daily - 7 x weekly - 1 sets - 3 reps - 20-30 seconds hold Forearm Plank on Wall - 2 x daily - 7 x weekly - 1 sets - 3 reps - 30 seconds hold Prone on Elbows Stretch - 1 x daily - 7 x weekly - 3 sets - 10 reps Prone Alternating Arm and Leg Lifts - 1 x daily - 7 x weekly - 1 sets - 10 reps Hip Flexor Stretch with Chair - 2 x daily - 7 x weekly - 1 sets - 2 reps - 30 seconds hold Standing Back Flexion Stretch with Foot on Chair - 2 x daily - 7 x weekly - 1 sets - 10 reps Squat with Chair Touch - 2 x daily - 7 x weekly - 1 sets - 10 reps

## 2021-01-28 NOTE — Addendum Note (Signed)
Addended by: Margretta Ditty M on: 01/28/2021 07:06 PM   Modules accepted: Orders
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
## 2021-02-15 ENCOUNTER — Ambulatory Visit (INDEPENDENT_AMBULATORY_CARE_PROVIDER_SITE_OTHER): Payer: BC Managed Care – PPO | Admitting: Sports Medicine

## 2021-02-15 ENCOUNTER — Other Ambulatory Visit: Payer: Self-pay

## 2021-02-15 DIAGNOSIS — M51369 Other intervertebral disc degeneration, lumbar region without mention of lumbar back pain or lower extremity pain: Secondary | ICD-10-CM

## 2021-02-15 DIAGNOSIS — M5136 Other intervertebral disc degeneration, lumbar region: Secondary | ICD-10-CM

## 2021-02-15 MED ORDER — PREGABALIN 200 MG PO CAPS: 200.0000 mg | ORAL_CAPSULE | Freq: Three times a day (TID) | ORAL | 3 refills | Status: DC

## 2021-02-15 NOTE — Assessment & Plan Note (Signed)
This pleasant 40 year old male returns, known lumbar DDD, initially developed well with physical therapy, ultimately we needed to go up to 200 3 times daily for his Lyrica, MRI showed L5-S1 disc disease, right L5-S1 interlaminar epidural provided good relief, proceeding with #2 of 3. Return to see me 1 month after the injection.

## 2021-02-15 NOTE — Progress Notes (Signed)
    Procedures performed today:    None.  Independent interpretation of notes and tests performed by another provider:   None.  Brief History, Exam, Impression, and Recommendations:    Lumbar degenerative disc disease This pleasant 40 year old male returns, known lumbar DDD, initially developed well with physical therapy, ultimately we needed to go up to 200 3 times daily for his Lyrica, MRI showed L5-S1 disc disease, right L5-S1 interlaminar epidural provided good relief, proceeding with #2 of 3. Return to see me 1 month after the injection.    ___________________________________________ Ihor Austin. Benjamin Stain, M.D., ABFM., CAQSM. Primary Care and Sports Medicine [facility]  Adjunct Instructor of Family Medicine  [employer]

## 2021-02-18 ENCOUNTER — Ambulatory Visit (INDEPENDENT_AMBULATORY_CARE_PROVIDER_SITE_OTHER): Payer: Self-pay | Admitting: Rehabilitative and Restorative Service Providers"

## 2021-02-18 ENCOUNTER — Other Ambulatory Visit: Payer: Self-pay

## 2021-02-18 ENCOUNTER — Encounter: Payer: Self-pay | Admitting: Rehabilitative and Restorative Service Providers"

## 2021-02-18 DIAGNOSIS — M545 Low back pain, unspecified: Secondary | ICD-10-CM

## 2021-02-18 DIAGNOSIS — R29898 Other symptoms and signs involving the musculoskeletal system: Secondary | ICD-10-CM
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
## 2021-02-26 ENCOUNTER — Ambulatory Visit
Admission: RE | Admit: 2021-02-26 | Discharge: 2021-02-26 | Disposition: A | Discharge status: Home / Self Care | Payer: BC Managed Care – PPO | Source: Ambulatory Visit | Attending: Sports Medicine | Admitting: Sports Medicine

## 2021-02-26 ENCOUNTER — Other Ambulatory Visit: Payer: Self-pay

## 2021-02-26 DIAGNOSIS — M47817 Spondylosis without myelopathy or radiculopathy, lumbosacral region: Secondary | ICD-10-CM | POA: Diagnosis not present

## 2021-02-26 DIAGNOSIS — M5136 Other intervertebral disc degeneration, lumbar region: Secondary | ICD-10-CM

## 2021-02-26 MED ORDER — METHYLPREDNISOLONE ACETATE 40 MG/ML INJ SUSP (RADIOLOG
80.0000 mg | Freq: Once | INTRAMUSCULAR | Status: AC
  Administered 2021-02-26: 80 mg via EPIDURAL

## 2021-02-26 MED ORDER — IOPAMIDOL (ISOVUE-M 200) INJECTION 41%
1.0000 mL | Freq: Once | INTRAMUSCULAR | Status: AC
  Administered 2021-02-26: 1 mL via EPIDURAL

## 2021-02-26 NOTE — Discharge Instructions (Signed)
Post Procedure Spinal Discharge Instruction Sheet  1. You may resume a regular diet and any medications that you routinely take (including pain medications).  2. No driving day of procedure.  3. Light activity throughout the rest of the day.  Do not do any strenuous work, exercise, bending or lifting.  The day following the procedure, you can resume normal physical activity but you should refrain from exercising or physical therapy for at least three days thereafter.   Common Side Effects:   Headaches- take your usual medications as directed by your physician.  Increase your fluid intake.  Caffeinated beverages may be helpful.  Lie flat in bed until your headache resolves.   Restlessness or inability to sleep- you may have trouble sleeping for the next few days.  Ask your referring physician if you need any medication for sleep.   Facial flushing or redness- should subside within a few days.   Increased pain- a temporary increase in pain a day or two following your procedure is not unusual.  Take your pain medication as prescribed by your referring physician.   Leg cramps  Please contact our office at [phone] for the following symptoms:  Fever greater than 100 degrees.  Headaches unresolved with medication after 2-3 days.  Increased swelling, pain, or redness at injection site.   Thank you for visiting [facility] today.  

## 2021-03-04 DIAGNOSIS — E291 Testicular hypofunction: Secondary | ICD-10-CM | POA: Diagnosis not present

## 2021-03-04 DIAGNOSIS — G4733 Obstructive sleep apnea (adult) (pediatric): Secondary | ICD-10-CM | POA: Diagnosis not present

## 2021-03-04 DIAGNOSIS — Z9989 Dependence on other enabling machines and devices: Secondary | ICD-10-CM | POA: Diagnosis not present

## 2021-03-05 DIAGNOSIS — E291 Testicular hypofunction: Secondary | ICD-10-CM | POA: Diagnosis not present

## 2021-03-24 ENCOUNTER — Other Ambulatory Visit: Payer: Self-pay

## 2021-03-24 ENCOUNTER — Ambulatory Visit (INDEPENDENT_AMBULATORY_CARE_PROVIDER_SITE_OTHER): Payer: BC Managed Care – PPO | Admitting: Sports Medicine

## 2021-03-24 DIAGNOSIS — M51369 Other intervertebral disc degeneration, lumbar region without mention of lumbar back pain or lower extremity pain: Secondary | ICD-10-CM

## 2021-03-24 DIAGNOSIS — M5136 Other intervertebral disc degeneration, lumbar region: Secondary | ICD-10-CM

## 2021-03-24 NOTE — Progress Notes (Signed)
    Procedures performed today:    None.  Independent interpretation of notes and tests performed by another provider:   None.  Brief History, Exam, Impression, and Recommendations:    Lumbar degenerative disc disease Known lumbar DDD, he had initially done relatively well with formal physical therapy, continued to have discomfort so we have continued to manage him conservatively, he is currently at 200 mg of Lyrica 3 times daily, MRI showed L5-S1 disc disease and an L5-S1 interlaminar epidural has provided good relief on the first and second occasion, we will proceed with L5-S1 interlaminar epidural #3, he has established with a pain management provider, and tells me he is not interested in surgical intervention. I will forward them the note, should we not get sufficient relief with epidural #3 and Lyrica currently at max dose we will need assistance with chronic opiate analgesia.    ___________________________________________ Ihor Austin. Benjamin Stain, M.D., ABFM., CAQSM. Primary Care and Sports Medicine [place] [facility]  Adjunct Instructor of Family Medicine  [employer]

## 2021-03-24 NOTE — Assessment & Plan Note (Signed)
Known lumbar DDD, he had initially done relatively well with formal physical therapy, continued to have discomfort so we have continued to manage him conservatively, he is currently at 200 mg of Lyrica 3 times daily, MRI showed L5-S1 disc disease and an L5-S1 interlaminar epidural has provided good relief on the first and second occasion, we will proceed with L5-S1 interlaminar epidural #3, he has established with a pain management provider, and tells me he is not interested in surgical intervention. I will forward them the note, should we not get sufficient relief with epidural #3 and Lyrica currently at max dose we will need assistance with chronic opiate analgesia.

## 2021-03-30 ENCOUNTER — Other Ambulatory Visit: Payer: Self-pay

## 2021-03-30 ENCOUNTER — Ambulatory Visit
Admission: RE | Admit: 2021-03-30 | Discharge: 2021-03-30 | Disposition: A | Discharge status: Home / Self Care | Payer: BC Managed Care – PPO | Source: Ambulatory Visit | Attending: Sports Medicine | Admitting: Sports Medicine

## 2021-03-30 DIAGNOSIS — M47817 Spondylosis without myelopathy or radiculopathy, lumbosacral region: Secondary | ICD-10-CM | POA: Diagnosis not present

## 2021-03-30 DIAGNOSIS — M5136 Other intervertebral disc degeneration, lumbar region: Secondary | ICD-10-CM

## 2021-03-30 MED ORDER — IOPAMIDOL (ISOVUE-M 200) INJECTION 41%
1.0000 mL | Freq: Once | INTRAMUSCULAR | Status: AC
  Administered 2021-03-30: 1 mL via EPIDURAL

## 2021-03-30 MED ORDER — METHYLPREDNISOLONE ACETATE 40 MG/ML INJ SUSP (RADIOLOG
80.0000 mg | Freq: Once | INTRAMUSCULAR | Status: AC
  Administered 2021-03-30: 80 mg via EPIDURAL

## 2021-03-30 NOTE — Discharge Instructions (Signed)

## 2021-06-04 ENCOUNTER — Other Ambulatory Visit: Payer: Self-pay

## 2021-06-04 ENCOUNTER — Ambulatory Visit (INDEPENDENT_AMBULATORY_CARE_PROVIDER_SITE_OTHER): Payer: BC Managed Care – PPO | Admitting: Sports Medicine

## 2021-06-04 DIAGNOSIS — M5136 Other intervertebral disc degeneration, lumbar region: Secondary | ICD-10-CM

## 2021-06-04 DIAGNOSIS — M51369 Other intervertebral disc degeneration, lumbar region without mention of lumbar back pain or lower extremity pain: Secondary | ICD-10-CM

## 2021-06-04 NOTE — Progress Notes (Signed)
    Procedures performed today:    None.  Independent interpretation of notes and tests performed by another provider:   None.  Brief History, Exam, Impression, and Recommendations:    Lumbar degenerative disc disease This is a pleasant 40 year old male, known lumbar DDD, he does have desiccation and protrusion at the L5-S1 level, he has been through formal physical therapy, he is on max dose Lyrica, he has had 3 L5-S1 interlaminar epidurals with only marginal relief. Still has significant axial discomfort, he does have a pain management provider. He understands the limitations in back surgery for axial pain, I would however like him to go ahead and get the surgical opinion from Dr. Yetta Barre.    ___________________________________________ Ihor Austin. Benjamin Stain, M.D., ABFM., CAQSM. Primary Care and Sports Medicine [place] [facility]  Adjunct Instructor of Family Medicine  [employer]

## 2021-06-04 NOTE — Assessment & Plan Note (Signed)
This is a pleasant 40 year old male, known lumbar DDD, he does have desiccation and protrusion at the L5-S1 level, he has been through formal physical therapy, he is on max dose Lyrica, he has had 3 L5-S1 interlaminar epidurals with only marginal relief. Still has significant axial discomfort, he does have a pain management provider. He understands the limitations in back surgery for axial pain, I would however like him to go ahead and get the surgical opinion from Dr. Yetta Barre.

## 2021-06-09 DIAGNOSIS — G4733 Obstructive sleep apnea (adult) (pediatric): Secondary | ICD-10-CM | POA: Diagnosis not present

## 2021-06-09 DIAGNOSIS — Z9989 Dependence on other enabling machines and devices: Secondary | ICD-10-CM | POA: Diagnosis not present

## 2021-06-09 DIAGNOSIS — I10 Essential (primary) hypertension: Secondary | ICD-10-CM | POA: Diagnosis not present

## 2021-06-09 DIAGNOSIS — E291 Testicular hypofunction: Secondary | ICD-10-CM | POA: Diagnosis not present

## 2021-06-11 DIAGNOSIS — E291 Testicular hypofunction: Secondary | ICD-10-CM | POA: Diagnosis not present

## 2021-06-24 DIAGNOSIS — G8929 Other chronic pain: Secondary | ICD-10-CM | POA: Diagnosis not present

## 2021-06-24 DIAGNOSIS — M545 Low back pain, unspecified: Secondary | ICD-10-CM | POA: Diagnosis not present

## 2021-11-05 ENCOUNTER — Other Ambulatory Visit: Payer: Self-pay

## 2021-11-05 ENCOUNTER — Ambulatory Visit (INDEPENDENT_AMBULATORY_CARE_PROVIDER_SITE_OTHER): Payer: BC Managed Care – PPO

## 2021-11-05 ENCOUNTER — Ambulatory Visit (INDEPENDENT_AMBULATORY_CARE_PROVIDER_SITE_OTHER): Payer: BC Managed Care – PPO | Admitting: Sports Medicine

## 2021-11-05 DIAGNOSIS — M1712 Unilateral primary osteoarthritis, left knee: Secondary | ICD-10-CM

## 2021-11-05 NOTE — Progress Notes (Signed)
° ° °  Procedures performed today:    Procedure: Real-time Ultrasound Guided injection of the right knee Device: Samsung HS60  Verbal informed consent obtained.  Time-out conducted.  Noted no overlying erythema, induration, or other signs of local infection.  Skin prepped in a sterile fashion.  Local anesthesia: Topical Ethyl chloride.  With sterile technique and under real time ultrasound guidance: Noted trace effusion, 1 cc Kenalog 40, 2 cc lidocaine, 2 cc bupivacaine injected easily Completed without difficulty  Advised to call if fevers/chills, erythema, induration, drainage, or persistent bleeding.  Images permanently stored and available for review in PACS.  Impression: Technically successful ultrasound guided injection.  Independent interpretation of notes and tests performed by another provider:   None.  Brief History, Exam, Impression, and Recommendations:    Primary osteoarthritis of left knee Recurrence of right knee pain, repeat injection today, last injection was March 2022.  Chronic process with exacerbation and pharmacologic intervention  ___________________________________________ Steve [name]. Benjamin Stain, M.D., ABFM., CAQSM. Primary Care and Sports Medicine [place] [facility]  Adjunct Instructor of Family Medicine  [employer] [facility] [facility] [employer]

## 2021-11-05 NOTE — Assessment & Plan Note (Signed)
Recurrence of right knee pain, repeat injection today, last injection was March 2022.

## 2021-11-24 DIAGNOSIS — G4733 Obstructive sleep apnea (adult) (pediatric): Secondary | ICD-10-CM | POA: Diagnosis not present

## 2021-11-24 DIAGNOSIS — E291 Testicular hypofunction: Secondary | ICD-10-CM | POA: Diagnosis not present

## 2021-11-24 DIAGNOSIS — Z9989 Dependence on other enabling machines and devices: Secondary | ICD-10-CM | POA: Diagnosis not present

## 2021-12-23 DIAGNOSIS — M47816 Spondylosis without myelopathy or radiculopathy, lumbar region: Secondary | ICD-10-CM | POA: Diagnosis not present

## 2021-12-27 ENCOUNTER — Telehealth: Payer: Self-pay

## 2021-12-27 NOTE — Telephone Encounter (Signed)
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  ?

## 2021-12-27 NOTE — Telephone Encounter (Signed)
Left msg for patient that letter was available to download from [place].  ?

## 2022-01-19 ENCOUNTER — Telehealth: Payer: Self-pay | Admitting: Internal Medicine

## 2022-01-19 NOTE — Telephone Encounter (Signed)
Patient came in to office and dropped of Work Restrictions Paperwork. Patient stated he needs the initial paperwork revised. Paperwork placed in providers box. lmr ?

## 2022-01-20 NOTE — Telephone Encounter (Signed)
There is nothing for me to fill out on the paperwork he left, I am assuming he just wanted his letter revised to say limited on all of the components of his job description.  I have provided a new letter. ?

## 2022-01-20 NOTE — Telephone Encounter (Signed)
Thank you, I will informed patient. ?

## 2022-02-04 ENCOUNTER — Telehealth (INDEPENDENT_AMBULATORY_CARE_PROVIDER_SITE_OTHER): Payer: BC Managed Care – PPO | Admitting: Sports Medicine

## 2022-02-04 DIAGNOSIS — M1712 Unilateral primary osteoarthritis, left knee: Secondary | ICD-10-CM | POA: Diagnosis not present

## 2022-02-04 NOTE — Progress Notes (Signed)
? ?  Virtual Visit via Telephone ?  ?I connected with  Steve [name].  on 02/04/22 by telephone/telehealth and verified that I am speaking with the correct person using two identifiers. ?  ?I discussed the limitations, risks, security and privacy concerns of performing an evaluation and management service by telephone, including the higher likelihood of inaccurate diagnosis and treatment, and the availability of in person appointments.  We also discussed the likely need of an additional face to face encounter for complete and high quality delivery of care.  I also discussed with the patient that there may be a patient responsible charge related to this service. The patient expressed understanding and wishes to proceed. ? ?Provider location is in medical facility. ?Patient location is at their home, different from provider location. ?People involved in care of the patient during this telehealth encounter were myself, my nurse/medical assistant, and my front office/scheduling team member. ? ?Review of Systems: No fevers, chills, night sweats, weight loss, chest pain, or shortness of breath.  ? ?Objective Findings:   ? ?General: Speaking full sentences, no audible heavy breathing.  Sounds alert and appropriately interactive.   ? ?Independent interpretation of tests performed by another provider:  ? ?None. ? ?Brief History, Exam, Impression, and Recommendations:   ? ?Primary osteoarthritis of left knee ?Persistent knee pain, last injected November 05, 2021, we wrote a work note today under his direction.  It will be emailed to him. ? ? ?I discussed the above assessment and treatment plan with the patient. The patient was provided an opportunity to ask questions and all were answered. The patient agreed with the plan and demonstrated an understanding of the instructions. ?  ?The patient was advised to call back or seek an in-person evaluation if the symptoms worsen or if the condition fails to improve as  anticipated. ?  ?I provided 30 minutes of verbal and non-verbal time during this encounter date, time was needed to gather information, review chart, records, communicate/coordinate with staff remotely, as well as complete documentation. ? ? ?___________________________________________ ?Ihor Austin. Benjamin Stain, M.D., ABFM., CAQSM. ?Primary Care and Sports Medicine ?[facility] ? ?Adjunct Professor of Family Medicine  ?[employer] ?

## 2022-02-04 NOTE — Assessment & Plan Note (Signed)
Persistent knee pain, last injected November 05, 2021, we wrote a work note today under his direction.  It will be emailed to him. ?

## 2022-03-10 ENCOUNTER — Telehealth: Payer: Self-pay

## 2022-03-10 DIAGNOSIS — M5030 Other cervical disc degeneration, unspecified cervical region: Secondary | ICD-10-CM

## 2022-03-10 NOTE — Telephone Encounter (Signed)
Patient called to request a referral for a cervical epidural be placed for [facility].

## 2022-03-11 NOTE — Telephone Encounter (Signed)
Epidural ordered.  

## 2022-03-11 NOTE — Telephone Encounter (Signed)
Patient aware epidural was ordered.  ?

## 2022-03-15 ENCOUNTER — Ambulatory Visit
Admission: RE | Admit: 2022-03-15 | Discharge: 2022-03-15 | Disposition: A | Discharge status: Home / Self Care | Payer: BC Managed Care – PPO | Source: Ambulatory Visit | Attending: Sports Medicine | Admitting: Sports Medicine

## 2022-03-15 DIAGNOSIS — M5030 Other cervical disc degeneration, unspecified cervical region: Secondary | ICD-10-CM

## 2022-03-15 DIAGNOSIS — M47812 Spondylosis without myelopathy or radiculopathy, cervical region: Secondary | ICD-10-CM | POA: Diagnosis not present

## 2022-03-15 MED ORDER — METHYLPREDNISOLONE ACETATE 40 MG/ML INJ SUSP (RADIOLOG
80.0000 mg | Freq: Once | INTRAMUSCULAR | Status: AC
  Administered 2022-03-15: 80 mg via EPIDURAL

## 2022-03-15 MED ORDER — IOPAMIDOL (ISOVUE-M 200) INJECTION 41%
1.0000 mL | Freq: Once | INTRAMUSCULAR | Status: AC
  Administered 2022-03-15: 1 mL via EPIDURAL

## 2022-03-15 NOTE — Discharge Instructions (Signed)

## 2022-05-24 DIAGNOSIS — D751 Secondary polycythemia: Secondary | ICD-10-CM | POA: Diagnosis not present

## 2022-05-24 DIAGNOSIS — E291 Testicular hypofunction: Secondary | ICD-10-CM | POA: Diagnosis not present

## 2022-05-24 DIAGNOSIS — G4733 Obstructive sleep apnea (adult) (pediatric): Secondary | ICD-10-CM | POA: Diagnosis not present

## 2022-07-07 IMAGING — DX DG LUMBAR SPINE COMPLETE 4+V
5 series · 5 of 5 positions shown · non-contrast
Comparison: None.

CLINICAL DATA: Low back pain

EXAM:
LUMBAR SPINE - COMPLETE 4+ VIEW

[l-spine ap]
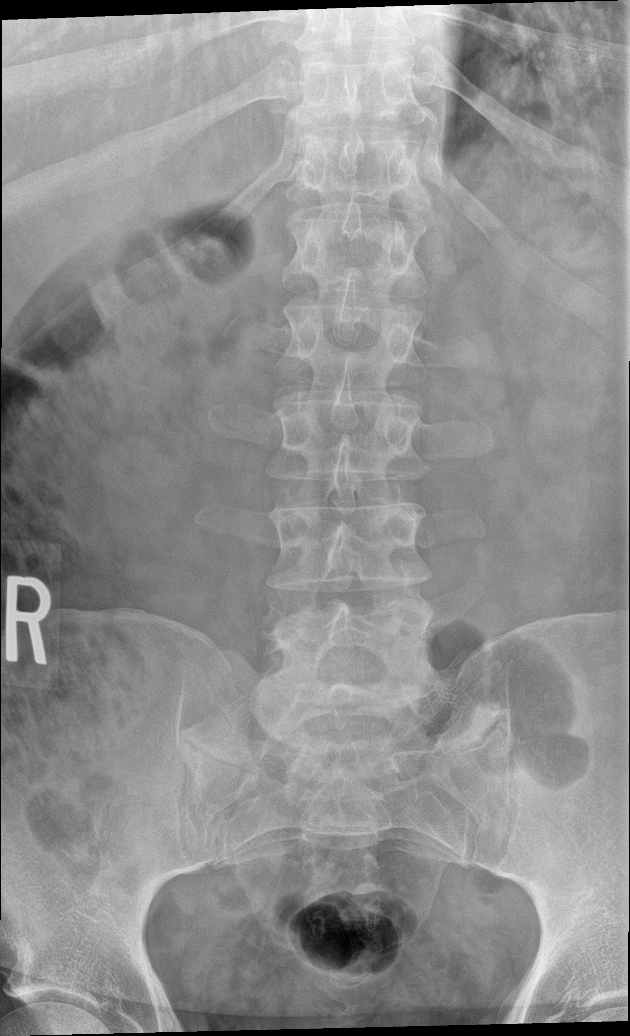

[l-spine obl (1 of 2)]
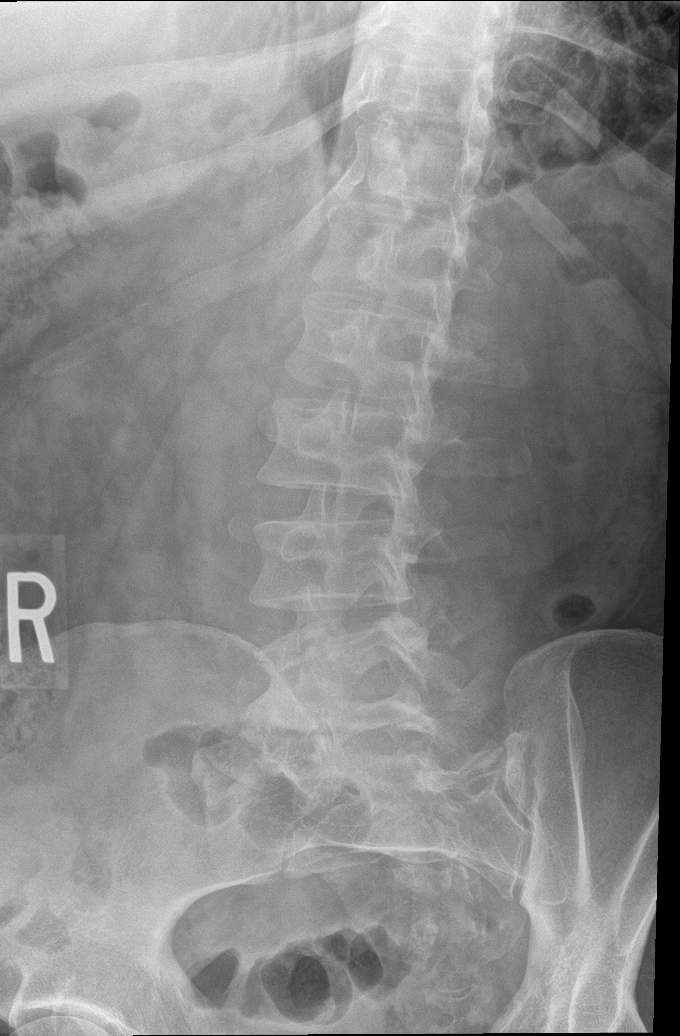

[l-spine obl (2 of 2)]
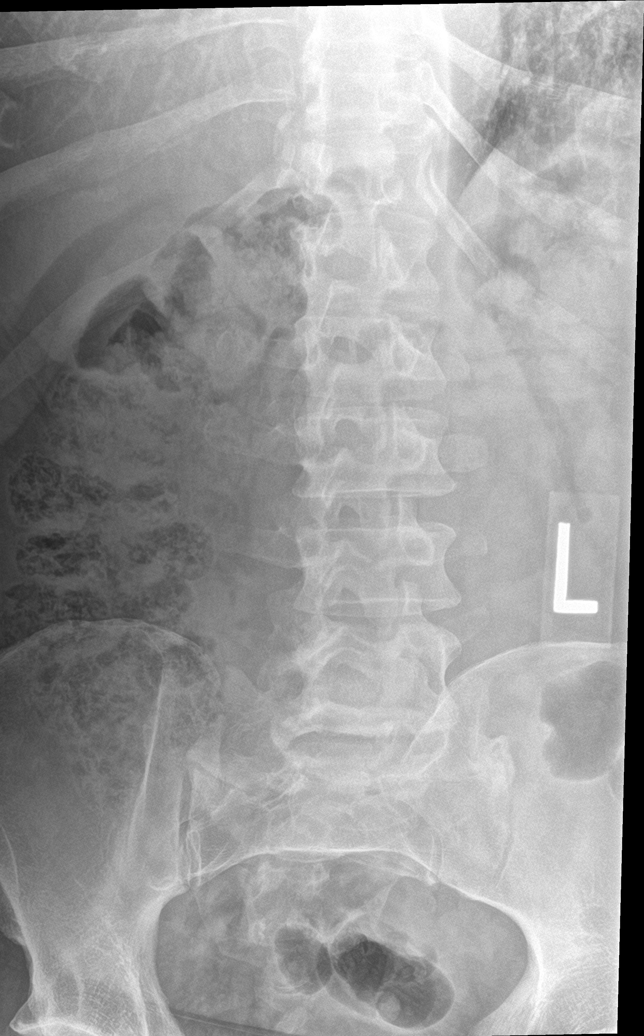

[l-spine lat]
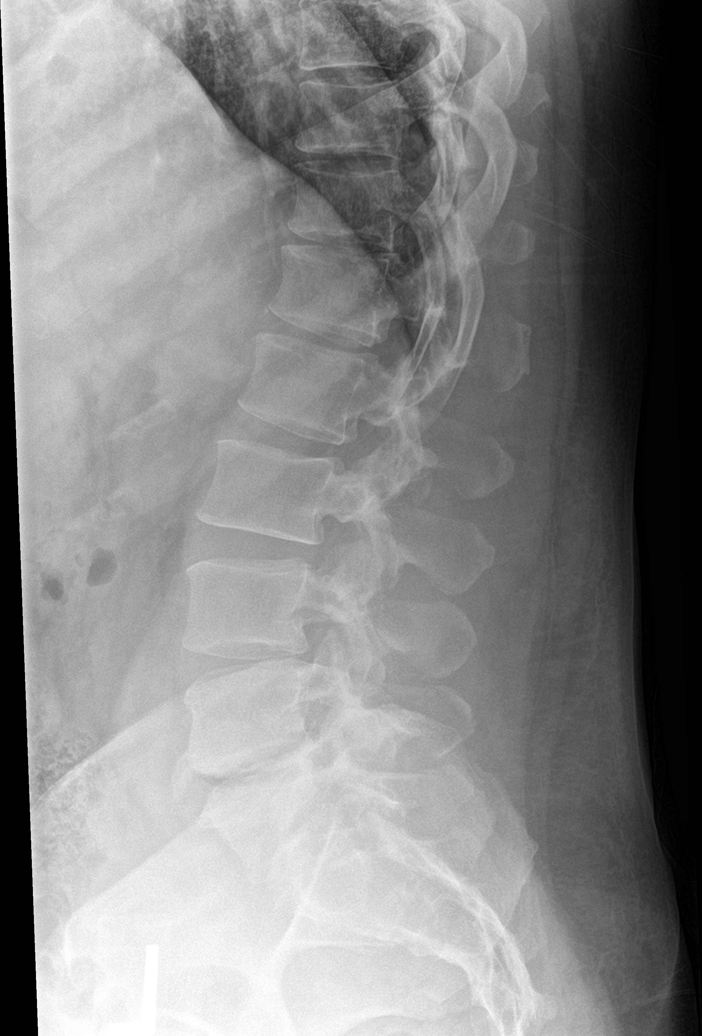

[l-spine spot]
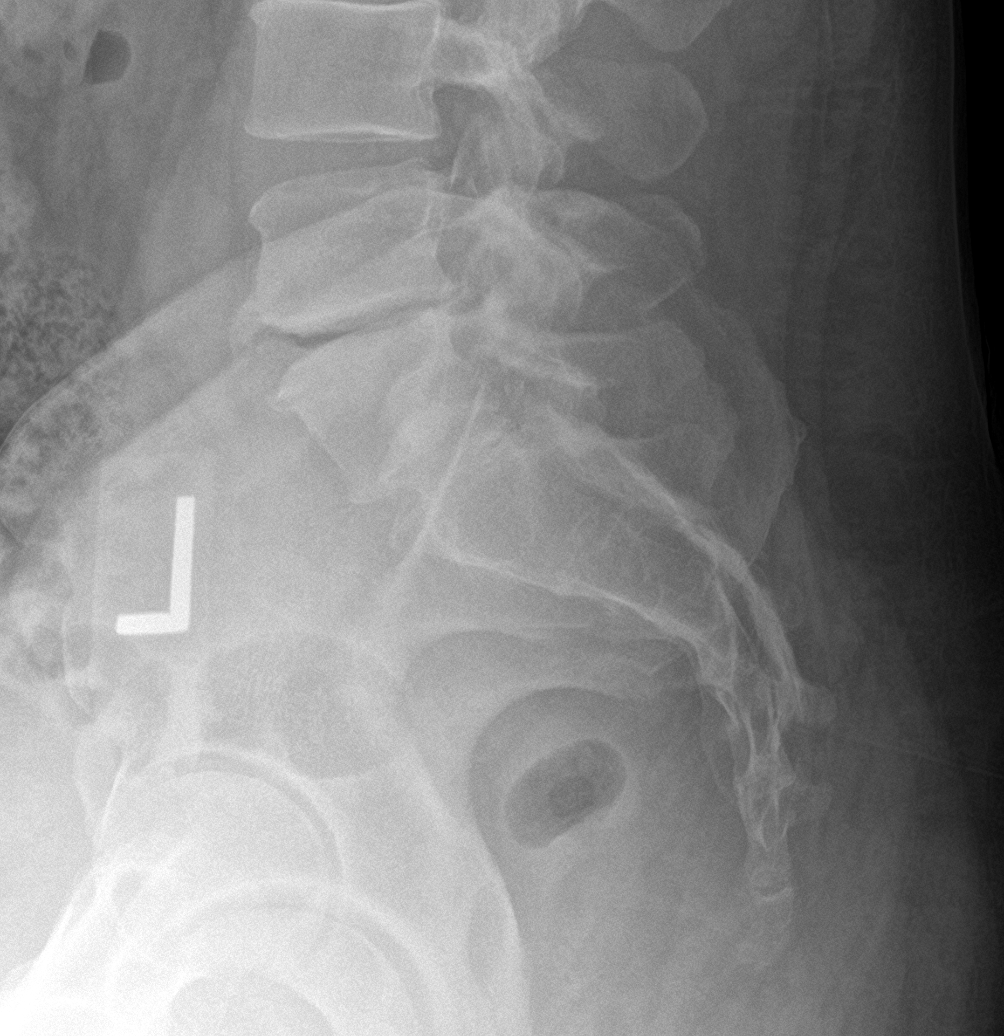

[5 of 5 positions shown; findings below may reference images not displayed]

FINDINGS: S1 partially lumbarized. Moderate to advanced disc degeneration
L5-S1 with disc space narrowing, gas in the disc space, and endplate
spurring. Mild disc space narrowing and spurring L4-5.

Negative for fracture or pars defect.
IMPRESSION: S1 is partially lumbarized. Moderate to advanced disc degeneration
and spurring L5-S1. Mild disc degeneration L4-5.

## 2022-08-01 DIAGNOSIS — E291 Testicular hypofunction: Secondary | ICD-10-CM | POA: Diagnosis not present

## 2022-08-30 ENCOUNTER — Ambulatory Visit (INDEPENDENT_AMBULATORY_CARE_PROVIDER_SITE_OTHER): Payer: BC Managed Care – PPO

## 2022-08-30 ENCOUNTER — Ambulatory Visit (INDEPENDENT_AMBULATORY_CARE_PROVIDER_SITE_OTHER): Payer: BC Managed Care – PPO | Admitting: Sports Medicine

## 2022-08-30 DIAGNOSIS — M5136 Other intervertebral disc degeneration, lumbar region: Secondary | ICD-10-CM | POA: Diagnosis not present

## 2022-08-30 DIAGNOSIS — M51369 Other intervertebral disc degeneration, lumbar region without mention of lumbar back pain or lower extremity pain: Secondary | ICD-10-CM

## 2022-08-30 DIAGNOSIS — M1712 Unilateral primary osteoarthritis, left knee: Secondary | ICD-10-CM

## 2022-08-30 MED ORDER — TRIAMCINOLONE ACETONIDE 40 MG/ML IJ SUSP
40.0000 mg | Freq: Once | INTRAMUSCULAR | Status: AC
  Administered 2022-08-30: 40 mg via INTRAMUSCULAR

## 2022-08-30 NOTE — Assessment & Plan Note (Signed)
Recurrence of left knee pain, known osteoarthritis, last injected February of this year, repeat left knee injection today.

## 2022-08-30 NOTE — Progress Notes (Signed)
    Procedures performed today:    Procedure: Real-time Ultrasound Guided injection of the left knee Device: Samsung HS60  Verbal informed consent obtained.  Time-out conducted.  Noted no overlying erythema, induration, or other signs of local infection.  Skin prepped in a sterile fashion.  Local anesthesia: Topical Ethyl chloride.  With sterile technique and under real time ultrasound guidance: Mild effusion noted, 1 cc Kenalog 40, 2 cc lidocaine, 2 cc bupivacaine injected easily Completed without difficulty  Advised to call if fevers/chills, erythema, induration, drainage, or persistent bleeding.  Images permanently stored and available for review in PACS.  Impression: Technically successful ultrasound guided injection.  Independent interpretation of notes and tests performed by another provider:   None.  Brief History, Exam, Impression, and Recommendations:    Primary osteoarthritis of left knee Recurrence of left knee pain, known osteoarthritis, last injected February of this year, repeat left knee injection today.  Lumbar degenerative disc disease Steve [name] does have known lumbar DDD, desiccation and protrusion of the L5-S1 level, he has had formal physical therapy, Lyrica, he had several L5-S1 epidurals only with marginal relief, he has been seeing pain management.  Historically he has been resistant to surgical consultation, but at this point I think it is reasonable to get a surgical opinion.  Chronic process with exacerbation and pharmacologic intervention  ____________________________________________ Steve [name]. Benjamin Stain, M.D., ABFM., CAQSM., AME. Primary Care and Sports Medicine [place] [facility]  Adjunct Professor of Family Medicine  [place] [employer]  Restaurant manager, fast food

## 2022-08-30 NOTE — Assessment & Plan Note (Signed)
BJ does have known lumbar DDD, desiccation and protrusion of the L5-S1 level, he has had formal physical therapy, Lyrica, he had several L5-S1 epidurals only with marginal relief, he has been seeing pain management.  Historically he has been resistant to surgical consultation, but at this point I think it is reasonable to get a surgical opinion.

## 2022-08-30 NOTE — Addendum Note (Signed)
Addended by: Carren Rang A on: 08/30/2022 04:43 PM   Modules accepted: Orders

## 2022-09-08 DIAGNOSIS — D751 Secondary polycythemia: Secondary | ICD-10-CM | POA: Diagnosis not present

## 2022-09-14 ENCOUNTER — Encounter (INDEPENDENT_AMBULATORY_CARE_PROVIDER_SITE_OTHER): Payer: BC Managed Care – PPO | Admitting: Sports Medicine

## 2022-09-14 DIAGNOSIS — M5136 Other intervertebral disc degeneration, lumbar region: Secondary | ICD-10-CM

## 2022-09-14 DIAGNOSIS — M51369 Other intervertebral disc degeneration, lumbar region without mention of lumbar back pain or lower extremity pain: Secondary | ICD-10-CM

## 2022-09-15 NOTE — Telephone Encounter (Signed)
To whoever is getting this approved through the patient's insurance company he has lumbar radiculopathy, he has had x-rays, has failed greater than 6 weeks of conservative treatment including epidural injections, and this MRI is for surgical planning.  I spent 5 total minutes of online digital evaluation and management services in this patient-initiated request for online care.

## 2022-09-21 DIAGNOSIS — D751 Secondary polycythemia: Secondary | ICD-10-CM | POA: Diagnosis not present

## 2022-09-23 DIAGNOSIS — M545 Low back pain, unspecified: Secondary | ICD-10-CM | POA: Diagnosis not present

## 2022-09-23 DIAGNOSIS — M5416 Radiculopathy, lumbar region: Secondary | ICD-10-CM | POA: Diagnosis not present

## 2022-09-25 ENCOUNTER — Ambulatory Visit (INDEPENDENT_AMBULATORY_CARE_PROVIDER_SITE_OTHER): Payer: BC Managed Care – PPO

## 2022-09-25 DIAGNOSIS — M545 Low back pain, unspecified: Secondary | ICD-10-CM | POA: Diagnosis not present

## 2022-09-25 DIAGNOSIS — M5136 Other intervertebral disc degeneration, lumbar region: Secondary | ICD-10-CM

## 2022-09-25 DIAGNOSIS — M5126 Other intervertebral disc displacement, lumbar region: Secondary | ICD-10-CM | POA: Diagnosis not present

## 2022-09-25 DIAGNOSIS — M48061 Spinal stenosis, lumbar region without neurogenic claudication: Secondary | ICD-10-CM | POA: Diagnosis not present

## 2022-09-30 DIAGNOSIS — M5416 Radiculopathy, lumbar region: Secondary | ICD-10-CM | POA: Diagnosis not present

## 2022-10-13 DIAGNOSIS — D751 Secondary polycythemia: Secondary | ICD-10-CM | POA: Diagnosis not present

## 2022-10-13 DIAGNOSIS — G4733 Obstructive sleep apnea (adult) (pediatric): Secondary | ICD-10-CM | POA: Diagnosis not present

## 2022-11-07 ENCOUNTER — Other Ambulatory Visit: Payer: Self-pay | Admitting: Orthopedic Surgery

## 2022-11-10 DIAGNOSIS — D751 Secondary polycythemia: Secondary | ICD-10-CM | POA: Diagnosis not present

## 2022-11-24 DIAGNOSIS — G4733 Obstructive sleep apnea (adult) (pediatric): Secondary | ICD-10-CM | POA: Diagnosis not present

## 2022-11-24 DIAGNOSIS — D751 Secondary polycythemia: Secondary | ICD-10-CM | POA: Diagnosis not present

## 2022-11-24 DIAGNOSIS — E291 Testicular hypofunction: Secondary | ICD-10-CM | POA: Diagnosis not present

## 2022-12-13 DIAGNOSIS — D751 Secondary polycythemia: Secondary | ICD-10-CM | POA: Diagnosis not present

## 2023-01-13 DIAGNOSIS — M25511 Pain in right shoulder: Secondary | ICD-10-CM | POA: Diagnosis not present

## 2023-01-13 DIAGNOSIS — Z8042 Family history of malignant neoplasm of prostate: Secondary | ICD-10-CM | POA: Diagnosis not present

## 2023-01-13 DIAGNOSIS — Z1330 Encounter for screening examination for mental health and behavioral disorders, unspecified: Secondary | ICD-10-CM | POA: Diagnosis not present

## 2023-01-13 DIAGNOSIS — Z131 Encounter for screening for diabetes mellitus: Secondary | ICD-10-CM | POA: Diagnosis not present

## 2023-01-13 DIAGNOSIS — Z6841 Body Mass Index (BMI) 40.0 and over, adult: Secondary | ICD-10-CM | POA: Diagnosis not present

## 2023-01-13 DIAGNOSIS — Z0000 Encounter for general adult medical examination without abnormal findings: Secondary | ICD-10-CM | POA: Diagnosis not present

## 2023-01-13 DIAGNOSIS — R7989 Other specified abnormal findings of blood chemistry: Secondary | ICD-10-CM | POA: Diagnosis not present

## 2023-01-13 DIAGNOSIS — G8929 Other chronic pain: Secondary | ICD-10-CM | POA: Diagnosis not present

## 2023-01-19 ENCOUNTER — Ambulatory Visit: Admit: 2023-01-19 | Payer: BC Managed Care – PPO | Admitting: Orthopedic Surgery

## 2023-01-19 SURGERY — TRANSFORAMINAL LUMBAR INTERBODY FUSION (TLIF) WITH PEDICLE SCREW FIXATION 1 LEVEL
Anesthesia: General | Laterality: Left

## 2023-01-24 DIAGNOSIS — Z0000 Encounter for general adult medical examination without abnormal findings: Secondary | ICD-10-CM | POA: Diagnosis not present

## 2023-01-24 DIAGNOSIS — E782 Mixed hyperlipidemia: Secondary | ICD-10-CM | POA: Diagnosis not present

## 2023-01-24 DIAGNOSIS — Z8042 Family history of malignant neoplasm of prostate: Secondary | ICD-10-CM | POA: Diagnosis not present

## 2023-04-03 DIAGNOSIS — M5416 Radiculopathy, lumbar region: Secondary | ICD-10-CM | POA: Diagnosis not present

## 2023-04-03 DIAGNOSIS — M5136 Other intervertebral disc degeneration, lumbar region: Secondary | ICD-10-CM | POA: Diagnosis not present

## 2023-04-08 ENCOUNTER — Encounter: Payer: Self-pay | Admitting: Sports Medicine

## 2023-04-10 ENCOUNTER — Telehealth: Payer: Self-pay

## 2023-04-10 NOTE — Telephone Encounter (Signed)
FMLA and disability forms do need an appointment but a form for the hot tub should be quick and easy, just leave it in my box.

## 2023-04-10 NOTE — Telephone Encounter (Signed)
Patient came into office to drop off [employer] form to be completed by PCP, form placed in Dr. Melvia Heaps box, thanks.

## 2023-04-10 NOTE — Telephone Encounter (Signed)
Pt called. Based on your last note  on back issue, can you fill out form or does he need an appointment?

## 2023-04-11 NOTE — Telephone Encounter (Signed)
Letter filled out today, I will give it to you.

## 2023-04-24 ENCOUNTER — Ambulatory Visit: Payer: BC Managed Care – PPO | Admitting: Sports Medicine

## 2023-04-24 ENCOUNTER — Other Ambulatory Visit (INDEPENDENT_AMBULATORY_CARE_PROVIDER_SITE_OTHER): Payer: BC Managed Care – PPO

## 2023-04-24 ENCOUNTER — Ambulatory Visit (INDEPENDENT_AMBULATORY_CARE_PROVIDER_SITE_OTHER): Payer: BC Managed Care – PPO

## 2023-04-24 DIAGNOSIS — M1712 Unilateral primary osteoarthritis, left knee: Secondary | ICD-10-CM

## 2023-04-24 DIAGNOSIS — M170 Bilateral primary osteoarthritis of knee: Secondary | ICD-10-CM | POA: Diagnosis not present

## 2023-04-24 DIAGNOSIS — Z0189 Encounter for other specified special examinations: Secondary | ICD-10-CM

## 2023-04-24 NOTE — Assessment & Plan Note (Addendum)
Recurrence left knee pain, last injection Nov 2023, repeated today. BJ was having some increasing mechanical symptoms and clicking, suspect intra-articular loose body versus meniscal tearing. We will consider MRI if not better in about 4 to 6 weeks after this injection, I do want updated x-rays today. RTC as needed.

## 2023-04-24 NOTE — Progress Notes (Signed)
    Procedures performed today:    Procedure: Real-time Ultrasound Guided injection of the left knee Device: Samsung HS60  Verbal informed consent obtained.  Time-out conducted.  Noted no overlying erythema, induration, or other signs of local infection.  Skin prepped in a sterile fashion.  Local anesthesia: Topical Ethyl chloride.  With sterile technique and under real time ultrasound guidance: mild to moderate effusion noted, 1 cc Kenalog 40, 2 cc lidocaine, 2 cc bupivacaine injected easily Completed without difficulty  Advised to call if fevers/chills, erythema, induration, drainage, or persistent bleeding.  Images permanently stored and available for review in PACS.  Impression: Technically successful ultrasound guided injection.  Independent interpretation of notes and tests performed by another provider:   None.  Brief History, Exam, Impression, and Recommendations:    Primary osteoarthritis of left knee Recurrence left knee pain, last injection Nov 2023, repeated today. BJ was having some increasing mechanical symptoms and clicking, suspect intra-articular loose body versus meniscal tearing. We will consider MRI if not better in about 4 to 6 weeks after this injection, I do want updated x-rays today. RTC as needed.    ____________________________________________ Ihor Austin. Benjamin Stain, M.D., ABFM., CAQSM., AME. Primary Care and Sports Medicine [place] [facility]  Adjunct Professor of Family Medicine  [place] [employer] [facility] [employer]  Restaurant manager, fast food

## 2023-04-25 DIAGNOSIS — M1712 Unilateral primary osteoarthritis, left knee: Secondary | ICD-10-CM | POA: Diagnosis not present

## 2023-04-25 MED ORDER — TRIAMCINOLONE ACETONIDE 40 MG/ML IJ SUSP
40.0000 mg | Freq: Once | INTRAMUSCULAR | Status: AC
Start: 2023-04-25 — End: 2023-04-25
  Administered 2023-04-25: 40 mg via INTRAMUSCULAR

## 2023-04-25 NOTE — Addendum Note (Signed)
Addended by: Carren Rang A on: 04/25/2023 10:34 AM   Modules accepted: Orders

## 2023-04-27 ENCOUNTER — Ambulatory Visit: Payer: BC Managed Care – PPO | Admitting: Sports Medicine

## 2023-05-25 DIAGNOSIS — G4733 Obstructive sleep apnea (adult) (pediatric): Secondary | ICD-10-CM | POA: Diagnosis not present

## 2023-05-25 DIAGNOSIS — E291 Testicular hypofunction: Secondary | ICD-10-CM | POA: Diagnosis not present

## 2023-05-25 DIAGNOSIS — D751 Secondary polycythemia: Secondary | ICD-10-CM | POA: Diagnosis not present

## 2023-06-15 DIAGNOSIS — D751 Secondary polycythemia: Secondary | ICD-10-CM | POA: Diagnosis not present

## 2023-07-28 ENCOUNTER — Ambulatory Visit: Payer: BC Managed Care – PPO | Admitting: Sports Medicine

## 2023-08-08 ENCOUNTER — Ambulatory Visit (INDEPENDENT_AMBULATORY_CARE_PROVIDER_SITE_OTHER): Payer: 59 | Admitting: Sports Medicine

## 2023-08-08 ENCOUNTER — Other Ambulatory Visit (INDEPENDENT_AMBULATORY_CARE_PROVIDER_SITE_OTHER): Payer: 59

## 2023-08-08 ENCOUNTER — Encounter: Payer: Self-pay | Admitting: Sports Medicine

## 2023-08-08 ENCOUNTER — Telehealth: Payer: Self-pay | Admitting: Sports Medicine

## 2023-08-08 DIAGNOSIS — M1712 Unilateral primary osteoarthritis, left knee: Secondary | ICD-10-CM

## 2023-08-08 MED ORDER — TRIAMCINOLONE ACETONIDE 40 MG/ML IJ SUSP
40.0000 mg | Freq: Once | INTRAMUSCULAR | Status: AC
Start: 2023-08-08 — End: 2023-08-08
  Administered 2023-08-08: 40 mg via INTRAMUSCULAR

## 2023-08-08 NOTE — Assessment & Plan Note (Addendum)
Pleasant 42 year old male, known knee osteoarthritis, last injection was in July 2024, he did not get significant relief. We did another steroid injection today for immediate relief, considering mechanical symptoms we will proceed with MRI and approval for viscosupplementation. If internal derangement such as a loose body or meniscal tear found we will consider referral for scope before [facility]. He will also discuss additional weight loss treatment with his PCP.

## 2023-08-08 NOTE — Progress Notes (Signed)
    Procedures performed today:    Procedure: Real-time Ultrasound Guided injection of the left knee Device: Samsung HS60  Verbal informed consent obtained.  Time-out conducted.  Noted no overlying erythema, induration, or other signs of local infection.  Skin prepped in a sterile fashion.  Local anesthesia: Topical Ethyl chloride.  With sterile technique and under real time ultrasound guidance: No effusion noted, 1 cc Kenalog 40, 2 cc lidocaine, 2 cc bupivacaine injected easily Completed without difficulty  Advised to call if fevers/chills, erythema, induration, drainage, or persistent bleeding.  Images permanently stored and available for review in PACS.  Impression: Technically successful ultrasound guided injection.  Independent interpretation of notes and tests performed by another provider:   None.  Brief History, Exam, Impression, and Recommendations:    Primary osteoarthritis of left knee Pleasant 41 year old male, known knee osteoarthritis, last injection was in July 2024, he did not get significant relief. We did another steroid injection today for immediate relief, considering mechanical symptoms we will proceed with MRI and approval for viscosupplementation. If internal derangement such as a loose body or meniscal tear found we will consider referral for scope before [facility]. He will also discuss additional weight loss treatment with his PCP.    ____________________________________________ Steve [name]. Steve [name], M.D., ABFM., CAQSM., AME. Primary Care and Sports Medicine [place] [facility]  Adjunct Professor of Family Medicine  [place] [employer] [facility] [employer]  Restaurant manager, fast food

## 2023-08-08 NOTE — Telephone Encounter (Signed)
Visco approval please, left knee, x-ray confirmed osteoarthritis, failed everything including medications and injections.

## 2023-08-09 NOTE — Telephone Encounter (Signed)
PA information submitted via [email or web address] for [facility] Paperwork has been printed and given to Dr. T for signatures. Once obtained, information will be faxed to [facility] at [phone]  

## 2023-08-27 ENCOUNTER — Ambulatory Visit: Payer: 59

## 2023-08-27 DIAGNOSIS — M1712 Unilateral primary osteoarthritis, left knee: Secondary | ICD-10-CM | POA: Diagnosis not present

## 2023-09-22 ENCOUNTER — Ambulatory Visit (INDEPENDENT_AMBULATORY_CARE_PROVIDER_SITE_OTHER): Payer: 59 | Admitting: Sports Medicine

## 2023-09-22 ENCOUNTER — Encounter: Payer: Self-pay | Admitting: Sports Medicine

## 2023-09-22 ENCOUNTER — Other Ambulatory Visit: Payer: Self-pay

## 2023-09-22 ENCOUNTER — Other Ambulatory Visit (INDEPENDENT_AMBULATORY_CARE_PROVIDER_SITE_OTHER): Payer: 59

## 2023-09-22 DIAGNOSIS — M1712 Unilateral primary osteoarthritis, left knee: Secondary | ICD-10-CM

## 2023-09-22 NOTE — Assessment & Plan Note (Signed)
Euflexxa No. 1 of 3 both knees, return in 1 week for #2 of 3.

## 2023-09-22 NOTE — Progress Notes (Signed)
    Procedures performed today:    Procedure: Real-time Ultrasound Guided injection of the right knee Device: Samsung HS60  Verbal informed consent obtained.  Time-out conducted.  Noted no overlying erythema, induration, or other signs of local infection.  Skin prepped in a sterile fashion.  Local anesthesia: Topical Ethyl chloride.  With sterile technique and under real time ultrasound guidance: Using a 22-gauge needle I injected a syringe of Euflexxa into the suprapatellar recess. Completed without difficulty  Advised to call if fevers/chills, erythema, induration, drainage, or persistent bleeding.  Images permanently stored and available for review in PACS.  Impression: Technically successful ultrasound guided injection.   Procedure: Real-time Ultrasound Guided injection of the left knee Device: Samsung HS60  Verbal informed consent obtained.  Time-out conducted.  Noted no overlying erythema, induration, or other signs of local infection.  Skin prepped in a sterile fashion.  Local anesthesia: Topical Ethyl chloride.  With sterile technique and under real time ultrasound guidance: Using a 22-gauge needle I injected a syringe of Euflexxa into the suprapatellar recess. Completed without difficulty  Advised to call if fevers/chills, erythema, induration, drainage, or persistent bleeding.  Images permanently stored and available for review in PACS.  Impression: Technically successful ultrasound guided injection.  Independent interpretation of notes and tests performed by another provider:   None.  Brief History, Exam, Impression, and Recommendations:    Primary osteoarthritis of left knee Euflexxa No. 1 of 3 both knees, return in 1 week for #2 of 3.    ____________________________________________ Ihor Austin. Benjamin Stain, M.D., ABFM., CAQSM., AME. Primary Care and Sports Medicine [place] [facility]  Adjunct Professor of Family Medicine  [place] [employer] [facility] [employer]  Restaurant manager, fast food

## 2023-09-22 NOTE — Addendum Note (Signed)
Addended by: Carren Rang A on: 09/22/2023 02:06 PM   Modules accepted: Orders

## 2023-09-29 ENCOUNTER — Other Ambulatory Visit (INDEPENDENT_AMBULATORY_CARE_PROVIDER_SITE_OTHER): Payer: 59

## 2023-09-29 ENCOUNTER — Ambulatory Visit (INDEPENDENT_AMBULATORY_CARE_PROVIDER_SITE_OTHER): Payer: 59 | Admitting: Sports Medicine

## 2023-09-29 ENCOUNTER — Encounter: Payer: Self-pay | Admitting: Sports Medicine

## 2023-09-29 DIAGNOSIS — M1712 Unilateral primary osteoarthritis, left knee: Secondary | ICD-10-CM

## 2023-09-29 NOTE — Assessment & Plan Note (Signed)
Euflexxa No. 2 of 3 both knees, avoid injections through his scar on the right knee as this does tend to ooze. Return to see me in 1 week for Euflexxa No. 3 of 3.

## 2023-09-29 NOTE — Progress Notes (Signed)
    Procedures performed today:    Procedure: Real-time Ultrasound Guided injection of the right knee Device: Samsung HS60  Verbal informed consent obtained.  Time-out conducted.  Noted no overlying erythema, induration, or other signs of local infection.  Skin prepped in a sterile fashion.  Local anesthesia: Topical Ethyl chloride.  With sterile technique and under real time ultrasound guidance: Moderate effusion noted using a 22-gauge needle I injected a syringe of Euflexxa into the suprapatellar recess. Completed without difficulty  Advised to call if fevers/chills, erythema, induration, drainage, or persistent bleeding.  Images permanently stored and available for review in PACS.  Impression: Technically successful ultrasound guided injection.   Procedure: Real-time Ultrasound Guided injection of the left knee Device: Samsung HS60  Verbal informed consent obtained.  Time-out conducted.  Noted no overlying erythema, induration, or other signs of local infection.  Skin prepped in a sterile fashion.  Local anesthesia: Topical Ethyl chloride.  With sterile technique and under real time ultrasound guidance: Using a 22-gauge needle I injected a syringe of Euflexxa into the suprapatellar recess. Completed without difficulty  Advised to call if fevers/chills, erythema, induration, drainage, or persistent bleeding.  Images permanently stored and available for review in PACS.  Impression: Technically successful ultrasound guided injection.  Independent interpretation of notes and tests performed by another provider:   None.  Brief History, Exam, Impression, and Recommendations:    Primary osteoarthritis of left knee Euflexxa No. 2 of 3 both knees, avoid injections through his scar on the right knee as this does tend to ooze. Return to see me in 1 week for Euflexxa No. 3 of 3.    ____________________________________________ Ihor Austin. Benjamin Stain, M.D., ABFM., CAQSM.,  AME. Primary Care and Sports Medicine [place] [facility]  Adjunct Professor of Family Medicine  [place] [employer] [facility] [employer]  Restaurant manager, fast food

## 2023-10-02 NOTE — Code Documentation (Signed)
Done

## 2023-10-02 NOTE — Addendum Note (Signed)
Addended by: Carren Rang A on: 10/02/2023 02:14 PM   Modules accepted: Orders

## 2023-10-06 ENCOUNTER — Other Ambulatory Visit (INDEPENDENT_AMBULATORY_CARE_PROVIDER_SITE_OTHER): Payer: PRIVATE HEALTH INSURANCE

## 2023-10-06 ENCOUNTER — Ambulatory Visit (INDEPENDENT_AMBULATORY_CARE_PROVIDER_SITE_OTHER): Payer: PRIVATE HEALTH INSURANCE | Admitting: Sports Medicine

## 2023-10-06 DIAGNOSIS — M170 Bilateral primary osteoarthritis of knee: Secondary | ICD-10-CM

## 2023-10-06 NOTE — Assessment & Plan Note (Signed)
 Euflexxa No. 3 of 3 both knees, return to see me 6 weeks as needed.

## 2023-10-06 NOTE — Progress Notes (Signed)
    Procedures performed today:    Procedure: Real-time Ultrasound Guided injection of the right knee Device: Samsung HS60  Verbal informed consent obtained.  Time-out conducted.  Noted no overlying erythema, induration, or other signs of local infection.  Skin prepped in a sterile fashion.  Local anesthesia: Topical Ethyl chloride.  With sterile technique and under real time ultrasound guidance: Moderate effusion noted using a 22-gauge needle I injected a syringe of Euflexxa into the suprapatellar recess. Completed without difficulty  Advised to call if fevers/chills, erythema, induration, drainage, or persistent bleeding.  Images permanently stored and available for review in PACS.  Impression: Technically successful ultrasound guided injection.   Procedure: Real-time Ultrasound Guided injection of the left knee Device: Samsung HS60  Verbal informed consent obtained.  Time-out conducted.  Noted no overlying erythema, induration, or other signs of local infection.  Skin prepped in a sterile fashion.  Local anesthesia: Topical Ethyl chloride.  With sterile technique and under real time ultrasound guidance: Using a 22-gauge needle I injected a syringe of Euflexxa into the suprapatellar recess. Completed without difficulty  Advised to call if fevers/chills, erythema, induration, drainage, or persistent bleeding.  Images permanently stored and available for review in PACS.  Impression: Technically successful ultrasound guided injection.  Independent interpretation of notes and tests performed by another provider:   None.  Brief History, Exam, Impression, and Recommendations:    Primary osteoarthritis of both knees Euflexxa No. 3 of 3 both knees, return to see me 6 weeks as needed.    ____________________________________________ Debby PARAS. Curtis, M.D., ABFM., CAQSM., AME. Primary Care and Sports Medicine  [facility]  Adjunct Professor of [facility]  Medicine  [employer]  [employer]

## 2023-10-10 ENCOUNTER — Encounter (INDEPENDENT_AMBULATORY_CARE_PROVIDER_SITE_OTHER): Payer: Self-pay

## 2023-10-10 NOTE — Addendum Note (Signed)
 Addended by: Carren Rang A on: 10/10/2023 10:19 AM   Modules accepted: Orders

## 2023-10-10 NOTE — Code Documentation (Signed)
 done

## 2023-10-11 ENCOUNTER — Encounter: Payer: Self-pay | Admitting: Sports Medicine

## 2023-10-13 ENCOUNTER — Encounter (INDEPENDENT_AMBULATORY_CARE_PROVIDER_SITE_OTHER): Payer: Self-pay | Admitting: Otolaryngology

## 2023-10-13 ENCOUNTER — Ambulatory Visit (INDEPENDENT_AMBULATORY_CARE_PROVIDER_SITE_OTHER): Payer: PRIVATE HEALTH INSURANCE | Admitting: Otolaryngology

## 2023-10-13 VITALS — BP 173/119 | HR 99 | Ht 71.5 in | Wt 279.0 lb

## 2023-10-13 DIAGNOSIS — G43809 Other migraine, not intractable, without status migrainosus: Secondary | ICD-10-CM | POA: Diagnosis not present

## 2023-10-13 DIAGNOSIS — Z789 Other specified health status: Secondary | ICD-10-CM | POA: Diagnosis not present

## 2023-10-13 DIAGNOSIS — G4733 Obstructive sleep apnea (adult) (pediatric): Secondary | ICD-10-CM

## 2023-10-13 DIAGNOSIS — J342 Deviated nasal septum: Secondary | ICD-10-CM | POA: Diagnosis not present

## 2023-10-13 DIAGNOSIS — J343 Hypertrophy of nasal turbinates: Secondary | ICD-10-CM

## 2023-10-13 NOTE — Patient Instructions (Signed)
 [email or web address]  This is the link to find a patient who has Inspire Implant to learn more about their experience

## 2023-10-13 NOTE — Progress Notes (Signed)
 ENT CONSULT:  Reason for Consult: OSA, CPAP intolerance    Ref: former patient of [facility] (via [employer])  HPI: Discussed the use of AI scribe software for clinical note transcription with the patient, who gave verbal consent to proceed.  History of Present Illness   The patient is a 17 yoM, previously diagnosed with sleep apnea in 2019, has been struggling with the use of CPAP therapy. They report attempting to use the CPAP machine every night but often find it removed in the morning, suggesting intolerance during sleep. The patient has tried various mask types, including full face, under the nose, and over the mouth, but none have been successful. They describe a sensation of suffocation and inadequate air supply with the full face mask.  The patient also reports frequent nocturnal awakenings, often to use the bathroom, during which they will put on the CPAP machine. However, they struggle to fall asleep initially with the machine on. Despite the use of the machine, the patient's spouse reports persistent snoring, and the patient often wakes with a mild to severe headache, affecting one eye and requiring immediate medication for relief. His headaches are unilateral and migraine like.   The patient has no history of insomnia, heart disease, or lung disease. They are currently on a regimen of baby aspirin. They also have a history of keloid formation post-surgery or trauma, which has resulted in raised scars in various locations central chest and shoulders following shoulder surgery). The patient is seeking an alternative to CPAP therapy for their sleep apnea. No recent sleep studies, was last seen by Sleep Medicine in 2020, recalls having AHI in 40's at the time.      Records Reviewed:  [facility] 12/19/2018  Assessment   1. OSA treated with BiPAP  2. Morbid obesity (*)    He is compliant with Bipap therapy and is waking up refreshed. He is doing well but  sometimes removes his mask in his sleep. He is working on adjusting his straps so this no longer occurs.   Plan   OSA   Continue Bipap pressure at 15/10 cm water  We discussed the importance of weight loss and daily exercise  Adjust humidity for comfort  Change CPAP/Bilevel supplies as recommended by DME company and insurance company  Patient ID: Steve [name] is a 43 y.o. male, former smoker (12 PPY, quit in 2000) with a history of GERD, hypertension, obesity, and OSA on Bipap.  OSA He presents for follow up regarding Bipap compliance.He has issues with mask fitting. He sometimes wakes up with and has pulled the mask off his face. He is waking up refreshed and has fatigue. The patient does not snore. He only sleep about 6 hours per night. He feels bipap has helped reduce his morning headaches.   The apnea- hypopnea index (AHI) is reduced to approximately 3. 3 Average number of total hours used nightly: 4 and 24 minutes  Compliance is 63.3% for more than 4 hours. But 90% compliant with total usage.   He is compliant with Bipap at 15/10 cm.water.  pregabalin  150 mg capsule Commonly known as: LYRICA  150 mg, Oral, 3 times a day  traMADol 50 mg tablet Commonly known as: ULTRAM 50 mg, Oral, 5 times a day, As needed. (at least every 4 hours apart)   Assessment   1. OSA treated with BiPAP  2. Morbid obesity (*)  3. Snoring   He is not compliant with Bipap therapy because  he does not always wear the Bipap mask. He is working on getting comfortable wearing something on his face.  Plan   OSA  Continue Bipap therapy at 15/10 cm. water pressure  Consider a pressure a reduction in the future if air leaks continue  New mask fitting in a full face style   We discussed the importance of weight loss and daily exercise  Adjust humidity for comfort  Change CPAP/Bilevel supplies as recommended by DME company and insurance company  Follow up in 8 weeks     Past Medical  History:  Diagnosis Date   Chronic shoulder pain right    Past Surgical History:  Procedure Laterality Date   ANKLE SURGERY Right    ELBOW SURGERY Left    FACIAL FRACTURE SURGERY     FOOT SURGERY Bilateral    KNEE SURGERY Left    SHOULDER SURGERY Right     History reviewed. No pertinent family history.  Social History:  reports that he has quit smoking. He has never used smokeless tobacco. He reports that he does not drink alcohol and does not use drugs.  Allergies:  Allergies  Allergen Reactions   Penicillins Rash   Bee Venom     Medications: I have reviewed the patient's current medications.  The PMH, PSH, Medications, Allergies, and SH were reviewed and updated.  ROS: Constitutional: Negative for fever, weight loss and weight gain. Cardiovascular: Negative for chest pain and dyspnea on exertion. Respiratory: Is not experiencing shortness of breath at rest. Gastrointestinal: Negative for nausea and vomiting. Neurological: Negative for headaches. Psychiatric: The patient is not nervous/anxious  Blood pressure (!) 173/119, pulse 99, height 5' 11.5 (1.816 m), weight 279 lb (126.6 kg), SpO2 95%. BMI 38  PHYSICAL EXAM:  Exam: General: Well-developed, well-nourished Respiratory Respiratory effort: Equal inspiration and expiration without stridor Cardiovascular Peripheral Vascular: Warm extremities with equal color/perfusion Eyes: No nystagmus with equal extraocular motion bilaterally Neuro/Psych/Balance: Patient oriented to person, place, and time; Appropriate mood and affect; Gait is intact with no imbalance; Cranial nerves I-XII are intact Head and Face Inspection: Normocephalic and atraumatic without mass or lesion Palpation: Facial skeleton intact without bony stepoffs Salivary Glands: No mass or tenderness Facial Strength: Facial motility symmetric and full bilaterally ENT Pinna: External ear intact and fully developed External canal: Canal is patent with  intact skin Tympanic Membrane: Clear and mobile External Nose: No scar or anatomic deformity Internal Nose: Septum is deviated to the left. No polyp, or purulence. Mucosal edema and erythema present.  Bilateral inferior turbinate hypertrophy.  Lips, Teeth, and gums: Mucosa and teeth intact and viable TMJ: No pain to palpation with full mobility Oral cavity/oropharynx: No erythema or exudate, no lesions present. Tonsils 1+. Friedman IV tongue position Nasopharynx: No mass or lesion with intact mucosa Hypopharynx: Intact mucosa without pooling of secretions Larynx Glottic: Full true vocal cord mobility without lesion or mass Supraglottic: Normal appearing epiglottis and AE folds Interarytenoid Space: Moderate pachydermia&edema Subglottic Space: Patent without lesion or edema Neck Neck and Trachea: Midline trachea without mass or lesion Thyroid : No mass or nodularity Lymphatics: No lymphadenopathy  Procedure:  Preoperative diagnosis: OSA CPAP intolerance  Postoperative diagnosis:   Same  Procedure: Flexible fiberoptic laryngoscopy  Surgeon: Jazaria Jarecki, MD  Anesthesia: Topical lidocaine and Afrin Complications: None Condition is stable throughout exam  Indications and consent:  The patient presents to the clinic with Indirect laryngoscopy view was incomplete. Thus it was recommended that they undergo a flexible fiberoptic laryngoscopy. All of the  risks, benefits, and potential complications were reviewed with the patient preoperatively and verbal informed consent was obtained.  Procedure: The patient was seated upright in the clinic. Topical lidocaine and Afrin were applied to the nasal cavity. After adequate anesthesia had occurred, I then proceeded to pass the flexible telescope into the nasal cavity. The nasal cavity was patent without rhinorrhea or polyp. The nasopharynx was also patent without mass or lesion. The base of tongue was visualized and was normal. There were no  signs of pooling of secretions in the piriform sinuses. The true vocal folds were mobile bilaterally. There were no signs of glottic or supraglottic mucosal lesion or mass. There was moderate interarytenoid pachydermia and post cricoid edema. The telescope was then slowly withdrawn and the patient tolerated the procedure throughout.    PROCEDURE NOTE: nasal endoscopy  Preoperative diagnosis: chronic nasal congestion symptoms  Postoperative diagnosis: same  Procedure: Diagnostic nasal endoscopy (68768)  Surgeon: Elena Larry, M.D.  Anesthesia: Topical lidocaine and Afrin  H&P REVIEW: The patient's history and physical were reviewed today prior to procedure. All medications were reviewed and updated as well. Complications: None Condition is stable throughout exam Indications and consent: The patient presents with symptoms of chronic sinusitis not responding to previous therapies. All the risks, benefits, and potential complications were reviewed with the patient preoperatively and informed consent was obtained. The time out was completed with confirmation of the correct procedure.   Procedure: The patient was seated upright in the clinic. Topical lidocaine and Afrin were applied to the nasal cavity. After adequate anesthesia had occurred, the rigid nasal endoscope was passed into the nasal cavity. The nasal mucosa, turbinates, septum, and sinus drainage pathways were visualized bilaterally. This revealed no purulence or significant secretions that might be cultured. There were no polyps or sites of significant inflammation. The mucosa was intact and there was no crusting present. The scope was then slowly withdrawn and the patient tolerated the procedure well. There were no complications or blood loss.  Assessment/Plan: Encounter Diagnoses  Name Primary?   OSA (obstructive sleep apnea) Yes   Intolerance of continuous positive airway pressure (CPAP) ventilation    Other migraine without  status migrainosus, not intractable    Nasal septal deviation    Hypertrophy of both inferior nasal turbinates     Assessment and Plan    Obstructive Sleep Apnea (OSA) OSA diagnosed in 2019. Using CPAP but unable to tolerate it through the night due to discomfort with various masks. Reports AHI of 43-48 events per hour. Sleep study done 2020 at [facility], no records available for review. Physical exam/scope exam shows large base of the tongue, consistent with OSA. Tonsils 1+. Discussed Inspire Implant, including criteria such as BMI and a need to repeat sleep study. BMI is currently 20, which may affect insurance approval. Need for updated sleep study.[facility] [place] Inspire Implant procedure, including placement of hypoglossal nerve stimulator, potential benefits, and limitations, such as not guaranteeing snoring cessation. Discussed potential keloid formation due to history of keloids with previous surgeries. Inspire Implant may not relieve sleep apnea if central apneas exceed 25%. Discussed drug-induced sleep endoscopy as part of prequalification.  Provided [employer] program link for further patient support. 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 Discussed potential for keloid formation with [facility]  Implant incisions and possible preventive measures such as steroid injections at the time of closure. - Consider steroid injections around the scar at the time of closure - Use specific surgical techniques to minimize trauma to the skin  Nasal congestion and septal deviation/inferior turbinate hypertrophy on exam  - consider a trial of Flonase and antihistamine   General Health Maintenance Takes baby aspirin daily. No history of heart or lung disease,  strokes, or heart attacks. No history of insomnia. - Continue baby aspirin as currently prescribed  Follow-up - Schedule follow-up appointment after sleep study results are available.        Thank you for allowing me to participate in the care of this patient. Please do not hesitate to contact me with any questions or concerns.   Elena Larry, MD Otolaryngology [facility] Phone: [phone] Fax: [phone]    10/13/2023, 6:42 PM

## 2023-10-16 ENCOUNTER — Encounter (INDEPENDENT_AMBULATORY_CARE_PROVIDER_SITE_OTHER): Payer: Self-pay

## 2023-11-24 ENCOUNTER — Ambulatory Visit (INDEPENDENT_AMBULATORY_CARE_PROVIDER_SITE_OTHER): Payer: PRIVATE HEALTH INSURANCE | Admitting: Otolaryngology

## 2024-06-04 ENCOUNTER — Encounter: Payer: Self-pay | Admitting: Sports Medicine
